# Patient Record
Sex: Female | Born: 1975 | Race: White | Hispanic: No | Marital: Single | Smoking: Former smoker
Health system: Southern US, Community
[De-identification: ages and names within clinical notes are randomized; demographics above are authoritative.]

## PROBLEM LIST (undated history)

## (undated) ENCOUNTER — Emergency Department (HOSPITAL_BASED_OUTPATIENT_CLINIC_OR_DEPARTMENT_OTHER): Discharge status: Absent without leave | Payer: Medicaid Other

## (undated) DIAGNOSIS — F1910 Other psychoactive substance abuse, uncomplicated: Secondary | ICD-10-CM

## (undated) DIAGNOSIS — F4310 Post-traumatic stress disorder, unspecified: Secondary | ICD-10-CM

## (undated) DIAGNOSIS — F32A Depression, unspecified: Secondary | ICD-10-CM

## (undated) DIAGNOSIS — M797 Fibromyalgia: Secondary | ICD-10-CM

## (undated) DIAGNOSIS — G43909 Migraine, unspecified, not intractable, without status migrainosus: Secondary | ICD-10-CM

## (undated) DIAGNOSIS — T4145XA Adverse effect of unspecified anesthetic, initial encounter: Secondary | ICD-10-CM

## (undated) DIAGNOSIS — R569 Unspecified convulsions: Secondary | ICD-10-CM

## (undated) DIAGNOSIS — F329 Major depressive disorder, single episode, unspecified: Secondary | ICD-10-CM

## (undated) DIAGNOSIS — F419 Anxiety disorder, unspecified: Secondary | ICD-10-CM

## (undated) DIAGNOSIS — T8859XA Other complications of anesthesia, initial encounter: Secondary | ICD-10-CM

## (undated) HISTORY — PX: WISDOM TOOTH EXTRACTION: SHX21

## (undated) HISTORY — PX: OTHER SURGICAL HISTORY: SHX169

---

## 1998-01-13 ENCOUNTER — Ambulatory Visit (HOSPITAL_COMMUNITY): Admission: RE | Admit: 1998-01-13 | Discharge: 1998-01-13 | Payer: Self-pay | Admitting: Gastroenterology

## 1999-02-24 ENCOUNTER — Other Ambulatory Visit: Admission: RE | Admit: 1999-02-24 | Discharge: 1999-02-24 | Payer: Self-pay | Admitting: Obstetrics and Gynecology

## 2000-06-22 ENCOUNTER — Other Ambulatory Visit: Admission: RE | Admit: 2000-06-22 | Discharge: 2000-06-22 | Payer: Self-pay | Admitting: Obstetrics and Gynecology

## 2000-06-22 ENCOUNTER — Other Ambulatory Visit: Admission: RE | Admit: 2000-06-22 | Discharge: 2000-06-22 | Payer: Self-pay | Admitting: *Deleted

## 2000-08-01 ENCOUNTER — Ambulatory Visit (HOSPITAL_COMMUNITY): Admission: RE | Admit: 2000-08-01 | Discharge: 2000-08-01 | Payer: Self-pay | Admitting: Endocrinology

## 2000-08-01 ENCOUNTER — Encounter: Payer: Self-pay | Admitting: Endocrinology

## 2000-08-21 DIAGNOSIS — R569 Unspecified convulsions: Secondary | ICD-10-CM

## 2000-08-21 HISTORY — DX: Unspecified convulsions: R56.9

## 2000-11-27 ENCOUNTER — Encounter: Admission: RE | Admit: 2000-11-27 | Discharge: 2000-11-27 | Payer: Self-pay | Admitting: Infectious Diseases

## 2001-07-11 ENCOUNTER — Other Ambulatory Visit: Admission: RE | Admit: 2001-07-11 | Discharge: 2001-07-11 | Payer: Self-pay | Admitting: Obstetrics and Gynecology

## 2001-10-11 ENCOUNTER — Encounter: Payer: Self-pay | Admitting: Gastroenterology

## 2001-10-11 ENCOUNTER — Ambulatory Visit (HOSPITAL_COMMUNITY): Admission: RE | Admit: 2001-10-11 | Discharge: 2001-10-11 | Payer: Self-pay | Admitting: Gastroenterology

## 2001-10-18 ENCOUNTER — Encounter: Payer: Self-pay | Admitting: Gastroenterology

## 2001-10-18 ENCOUNTER — Ambulatory Visit (HOSPITAL_COMMUNITY): Admission: RE | Admit: 2001-10-18 | Discharge: 2001-10-18 | Payer: Self-pay | Admitting: Gastroenterology

## 2002-01-24 ENCOUNTER — Encounter: Payer: Self-pay | Admitting: Emergency Medicine

## 2002-01-24 ENCOUNTER — Emergency Department (HOSPITAL_COMMUNITY): Admission: EM | Admit: 2002-01-24 | Discharge: 2002-01-24 | Payer: Self-pay | Admitting: Emergency Medicine

## 2002-04-23 ENCOUNTER — Emergency Department (HOSPITAL_COMMUNITY): Admission: EM | Admit: 2002-04-23 | Discharge: 2002-04-23 | Payer: Self-pay | Admitting: Emergency Medicine

## 2002-10-07 ENCOUNTER — Other Ambulatory Visit: Admission: RE | Admit: 2002-10-07 | Discharge: 2002-10-07 | Payer: Self-pay | Admitting: Obstetrics and Gynecology

## 2002-10-07 ENCOUNTER — Emergency Department (HOSPITAL_COMMUNITY): Admission: EM | Admit: 2002-10-07 | Discharge: 2002-10-07 | Payer: Self-pay | Admitting: Emergency Medicine

## 2002-12-12 ENCOUNTER — Inpatient Hospital Stay (HOSPITAL_COMMUNITY): Admission: AD | Admit: 2002-12-12 | Discharge: 2002-12-12 | Payer: Self-pay | Admitting: Obstetrics & Gynecology

## 2003-03-09 ENCOUNTER — Ambulatory Visit (HOSPITAL_COMMUNITY): Admission: RE | Admit: 2003-03-09 | Discharge: 2003-03-09 | Payer: Self-pay | Admitting: Obstetrics and Gynecology

## 2003-03-09 ENCOUNTER — Other Ambulatory Visit: Admission: RE | Admit: 2003-03-09 | Discharge: 2003-03-09 | Payer: Self-pay | Admitting: Obstetrics and Gynecology

## 2003-03-29 ENCOUNTER — Emergency Department (HOSPITAL_COMMUNITY): Admission: EM | Admit: 2003-03-29 | Discharge: 2003-03-29 | Payer: Self-pay | Admitting: *Deleted

## 2003-05-26 ENCOUNTER — Inpatient Hospital Stay (HOSPITAL_COMMUNITY): Admission: RE | Admit: 2003-05-26 | Discharge: 2003-05-29 | Payer: Self-pay | Admitting: Obstetrics and Gynecology

## 2003-06-05 ENCOUNTER — Encounter: Admission: RE | Admit: 2003-06-05 | Discharge: 2003-07-05 | Payer: Self-pay | Admitting: Obstetrics and Gynecology

## 2003-06-25 ENCOUNTER — Other Ambulatory Visit: Admission: RE | Admit: 2003-06-25 | Discharge: 2003-06-25 | Payer: Self-pay | Admitting: Obstetrics and Gynecology

## 2003-07-17 ENCOUNTER — Emergency Department (HOSPITAL_COMMUNITY): Admission: AD | Admit: 2003-07-17 | Discharge: 2003-07-17 | Payer: Self-pay | Admitting: Family Medicine

## 2003-11-15 ENCOUNTER — Emergency Department (HOSPITAL_COMMUNITY): Admission: EM | Admit: 2003-11-15 | Discharge: 2003-11-16 | Payer: Self-pay | Admitting: Emergency Medicine

## 2005-02-16 ENCOUNTER — Encounter: Admission: RE | Admit: 2005-02-16 | Discharge: 2005-02-16 | Payer: Self-pay | Admitting: Family Medicine

## 2007-02-22 ENCOUNTER — Emergency Department (HOSPITAL_COMMUNITY): Admission: EM | Admit: 2007-02-22 | Discharge: 2007-02-22 | Payer: Self-pay | Admitting: Emergency Medicine

## 2008-01-18 ENCOUNTER — Emergency Department (HOSPITAL_COMMUNITY): Admission: EM | Admit: 2008-01-18 | Discharge: 2008-01-18 | Payer: Self-pay | Admitting: Emergency Medicine

## 2008-03-09 ENCOUNTER — Emergency Department (HOSPITAL_BASED_OUTPATIENT_CLINIC_OR_DEPARTMENT_OTHER): Admission: EM | Admit: 2008-03-09 | Discharge: 2008-03-09 | Payer: Self-pay | Admitting: Emergency Medicine

## 2008-08-21 ENCOUNTER — Emergency Department: Payer: Self-pay | Admitting: Emergency Medicine

## 2008-10-06 ENCOUNTER — Emergency Department: Payer: Self-pay | Admitting: Unknown Physician Specialty

## 2008-10-30 ENCOUNTER — Emergency Department: Payer: Self-pay | Admitting: Emergency Medicine

## 2009-02-26 ENCOUNTER — Emergency Department (HOSPITAL_COMMUNITY): Admission: EM | Admit: 2009-02-26 | Discharge: 2009-02-27 | Payer: Self-pay | Admitting: Emergency Medicine

## 2009-03-05 ENCOUNTER — Emergency Department (HOSPITAL_BASED_OUTPATIENT_CLINIC_OR_DEPARTMENT_OTHER): Admission: EM | Admit: 2009-03-05 | Discharge: 2009-03-05 | Payer: Self-pay | Admitting: Emergency Medicine

## 2009-06-29 ENCOUNTER — Emergency Department: Payer: Self-pay | Admitting: Emergency Medicine

## 2009-07-01 ENCOUNTER — Ambulatory Visit: Payer: Self-pay | Admitting: Diagnostic Radiology

## 2009-07-01 ENCOUNTER — Emergency Department (HOSPITAL_BASED_OUTPATIENT_CLINIC_OR_DEPARTMENT_OTHER): Admission: EM | Admit: 2009-07-01 | Discharge: 2009-07-01 | Payer: Self-pay | Admitting: Emergency Medicine

## 2009-07-14 ENCOUNTER — Emergency Department (HOSPITAL_BASED_OUTPATIENT_CLINIC_OR_DEPARTMENT_OTHER): Admission: EM | Admit: 2009-07-14 | Discharge: 2009-07-14 | Payer: Self-pay | Admitting: Emergency Medicine

## 2009-07-25 ENCOUNTER — Emergency Department (HOSPITAL_BASED_OUTPATIENT_CLINIC_OR_DEPARTMENT_OTHER): Admission: EM | Admit: 2009-07-25 | Discharge: 2009-07-25 | Payer: Self-pay | Admitting: Emergency Medicine

## 2009-07-25 ENCOUNTER — Ambulatory Visit: Payer: Self-pay | Admitting: Diagnostic Radiology

## 2009-07-28 ENCOUNTER — Emergency Department (HOSPITAL_COMMUNITY): Admission: EM | Admit: 2009-07-28 | Discharge: 2009-07-28 | Payer: Self-pay | Admitting: Emergency Medicine

## 2009-07-30 ENCOUNTER — Emergency Department: Payer: Self-pay | Admitting: Emergency Medicine

## 2009-08-17 ENCOUNTER — Emergency Department (HOSPITAL_COMMUNITY): Admission: EM | Admit: 2009-08-17 | Discharge: 2009-08-18 | Payer: Self-pay | Admitting: Emergency Medicine

## 2009-08-17 ENCOUNTER — Emergency Department (HOSPITAL_BASED_OUTPATIENT_CLINIC_OR_DEPARTMENT_OTHER): Admission: EM | Admit: 2009-08-17 | Discharge: 2009-08-17 | Payer: Self-pay | Admitting: Emergency Medicine

## 2009-10-07 ENCOUNTER — Emergency Department (HOSPITAL_BASED_OUTPATIENT_CLINIC_OR_DEPARTMENT_OTHER): Admission: EM | Admit: 2009-10-07 | Discharge: 2009-10-07 | Payer: Self-pay | Admitting: Emergency Medicine

## 2009-12-31 ENCOUNTER — Emergency Department (HOSPITAL_BASED_OUTPATIENT_CLINIC_OR_DEPARTMENT_OTHER): Admission: EM | Admit: 2009-12-31 | Discharge: 2009-12-31 | Payer: Self-pay | Admitting: Emergency Medicine

## 2010-01-07 ENCOUNTER — Emergency Department (HOSPITAL_BASED_OUTPATIENT_CLINIC_OR_DEPARTMENT_OTHER): Admission: EM | Admit: 2010-01-07 | Discharge: 2010-01-07 | Payer: Self-pay | Admitting: Emergency Medicine

## 2010-04-13 ENCOUNTER — Emergency Department (HOSPITAL_BASED_OUTPATIENT_CLINIC_OR_DEPARTMENT_OTHER): Admission: EM | Admit: 2010-04-13 | Discharge: 2010-04-14 | Payer: Self-pay | Admitting: Emergency Medicine

## 2010-04-13 ENCOUNTER — Ambulatory Visit: Payer: Self-pay | Admitting: Diagnostic Radiology

## 2010-07-20 ENCOUNTER — Emergency Department (HOSPITAL_BASED_OUTPATIENT_CLINIC_OR_DEPARTMENT_OTHER): Admission: EM | Admit: 2010-07-20 | Discharge: 2010-07-20 | Payer: Self-pay | Admitting: Emergency Medicine

## 2010-10-02 ENCOUNTER — Emergency Department (HOSPITAL_BASED_OUTPATIENT_CLINIC_OR_DEPARTMENT_OTHER)
Admission: EM | Admit: 2010-10-02 | Discharge: 2010-10-02 | Disposition: A | Discharge status: Home / Self Care | Payer: Medicaid Other | Attending: Emergency Medicine | Admitting: Emergency Medicine

## 2010-10-02 DIAGNOSIS — F341 Dysthymic disorder: Secondary | ICD-10-CM | POA: Insufficient documentation

## 2010-10-02 DIAGNOSIS — J329 Chronic sinusitis, unspecified: Secondary | ICD-10-CM | POA: Insufficient documentation

## 2010-11-01 LAB — URINALYSIS, ROUTINE W REFLEX MICROSCOPIC
Bilirubin Urine: NEGATIVE
Ketones, ur: NEGATIVE mg/dL
Nitrite: NEGATIVE
Protein, ur: NEGATIVE mg/dL
Urobilinogen, UA: 0.2 mg/dL (ref 0.0–1.0)

## 2010-11-01 LAB — DIFFERENTIAL
Eosinophils Relative: 1 % (ref 0–5)
Lymphocytes Relative: 27 % (ref 12–46)
Lymphs Abs: 2.1 10*3/uL (ref 0.7–4.0)
Monocytes Relative: 4 % (ref 3–12)
Neutrophils Relative %: 68 % (ref 43–77)

## 2010-11-01 LAB — CBC
Hemoglobin: 14 g/dL (ref 12.0–15.0)
MCH: 31.8 pg (ref 26.0–34.0)
MCHC: 34.9 g/dL (ref 30.0–36.0)
Platelets: 257 10*3/uL (ref 150–400)
RDW: 11.4 % — ABNORMAL LOW (ref 11.5–15.5)

## 2010-11-01 LAB — COMPREHENSIVE METABOLIC PANEL
ALT: 18 U/L (ref 0–35)
Albumin: 4.5 g/dL (ref 3.5–5.2)
Calcium: 9.9 mg/dL (ref 8.4–10.5)
Glucose, Bld: 107 mg/dL — ABNORMAL HIGH (ref 70–99)
Sodium: 141 mEq/L (ref 135–145)
Total Protein: 8 g/dL (ref 6.0–8.3)

## 2010-11-01 LAB — PREGNANCY, URINE

## 2010-12-03 ENCOUNTER — Emergency Department (HOSPITAL_BASED_OUTPATIENT_CLINIC_OR_DEPARTMENT_OTHER)
Admission: EM | Admit: 2010-12-03 | Discharge: 2010-12-03 | Disposition: A | Discharge status: Home / Self Care | Payer: Self-pay | Attending: Emergency Medicine | Admitting: Emergency Medicine

## 2010-12-03 DIAGNOSIS — Z79899 Other long term (current) drug therapy: Secondary | ICD-10-CM | POA: Insufficient documentation

## 2010-12-03 DIAGNOSIS — G43909 Migraine, unspecified, not intractable, without status migrainosus: Secondary | ICD-10-CM | POA: Insufficient documentation

## 2011-01-06 NOTE — Op Note (Signed)
NAME:  Shelly Cummings, Shelly Cummings                           ACCOUNT NO.:  [id number]   MEDICAL RECORD NO.:  [id number]                   PATIENT TYPE:  INP   LOCATION:  9102                                 FACILITY:  [facility]   PHYSICIAN:  Malva Limes, M.D.                 DATE OF BIRTH:  1975-11-13   DATE OF PROCEDURE:  05/26/2003  DATE OF DISCHARGE:                                 OPERATIVE REPORT   PREOPERATIVE DIAGNOSES:  1. Intrauterine pregnancy at term.  2. Persistent breech presentation.  3. The patient declines attempted external version.   POSTOPERATIVE DIAGNOSES:  1. Intrauterine pregnancy at term.  2. Persistent breech presentation.  3. The patient declines attempted external version.   PROCEDURE:  Primary low transverse cesarean section.   SURGEON:  Mark E. Dareen Piano, M.D.   ANESTHESIA:  Spinal.   ANTIBIOTICS:  Ancef 1 gram.   DRAINS:  Foley bedside drainage.   ESTIMATED BLOOD LOSS:  900 mL.   COMPLICATIONS:  None.   SPECIMENS:  None.   FINDINGS:  The patient had a normal fallopian tubes and ovaries bilaterally.  The uterus is normal in size and shape.  The patient delivered one live  viable white female infant with Apgars 8 at one minute and 9 at five  minutes.  The weight was 7 pounds and 14 ounces.   PROCEDURE:  The patient was taken to the operating room where a spinal  anesthetic was administered.  She was then placed in the dorsal supine  position with a left lateral tilt.  The patient was prepped with Hibiclens  and a Foley catheter was then placed.  She was draped in the usual fashion  for this procedure.  A Pfannenstiel incision was made.  This was carried  down to the fascia.  The fascia was entered in the midline and extended  laterally with the Mayo scissors.  The rectus muscles were dissected from  the fascia with the Bovie.  The rectus muscles were divided in the midline  and taken superiorly and inferiorly.  Parietal peritoneum was entered  sharply.   The bladder flap was taken down sharply.  A low transverse uterine  incision was made in the midline and extended laterally with blunt  dissection.  Amniotic sac was then entered and fluid was noted to be clear.  The infant was a footling breech.  The infant was delivered in a breech  presentation.  On delivery of the head, the oropharynx and nostrils were  bulb suctioned.  The cord was then doubly clamped and cut and the infant  handed to the waiting NICU team.  Cord blood was then obtained.  The  placenta was then manually removed.  The uterus exteriorized and the uterine  cavity was inspected.  The uterine cavity was wiped with a wet lap.  The  uterine incision was closed in a single layer of 0  chromic in a running  locking fashion.  The bladder flap was closed using 3-0 chromic in running  fashion.  The uterus was placed back in the abdominal cavity.  Hemostasis  was again checked and found to be adequate.  The parietal peritoneum and  rectus muscles were reapproximated in the midline using 0 chromic in a  running fashion.  The fascia was closed using 0 Monocryl suture in a running  fashion.  Subcuticular tissue was made hemostatic with the Bovie.  Stainless  steel clips were used to close the skin.  The patient tolerated the  procedure well.  She was taken to the recovery room in stable condition.  Instrument and lap counts were correct x2.                                               Malva Limes, M.D.    MA/MEDQ  D:  05/26/2003  T:  05/26/2003  Job:  [id number]

## 2011-01-06 NOTE — Discharge Summary (Signed)
   NAMEMarland Kitchen  Shelly Cummings, Shelly Cummings                           ACCOUNT NO.:  [id number]   MEDICAL RECORD NO.:  [id number]                   PATIENT TYPE:  INP   LOCATION:  9102                                 FACILITY:  [facility]   PHYSICIAN:  Gerrit Friends. Aldona Bar, M.D.                DATE OF BIRTH:  [birth year]   DATE OF ADMISSION:  05/26/2003  DATE OF DISCHARGE:  05/29/2003                                 DISCHARGE SUMMARY   DISCHARGE DIAGNOSES:  1. Term pregnancy, delivered 7 pound 14 ounce female infant Apgars 8 and 9.  2. Blood type A-.  Baby A- also.  RhoGAM not needed.  3. Breech presentation at term.   PROCEDURE:  Primary low transverse cesarean section.   SUMMARY:  This 35 year old gravida 2, para 0 was admitted for a primary  cesarean section at term with a breech presentation.  She was taken to the  OR on the day of admission, October 5 where she underwent a primary low  transverse cesarean section with delivery of a 7 pound 14 ounce female  infant.  Her postpartum course was uncomplicated.  As the baby was also Rh  negative RhoGAM was not needed.  The patient's discharge hemoglobin was 10.3  with a white count of 12,600 and a platelet count of 195,000.  She remained  afebrile during her hospital course.  On the morning of October 8 the wound  was clean and dry and her staples were removed and her wound was Steri-  Stripped with Benzoin.  She was given all appropriate instructions at the  time of discharge on October 8 and understood all instructions well.   DISCHARGE MEDICATIONS:  1. Vitamins one daily (the patient breast-feeding).  2. Ferrous sulfate 300 mg daily.  3. Motrin 600 mg q.6h. as needed for pain.  4. Tylox one to two q.4-6h. as needed for severe pain.   FOLLOWUP:  She will return to the office for follow-up in approximately four  weeks' time.   CONDITION ON DISCHARGE:  Improved.                                               Gerrit Friends. Aldona Bar, M.D.    RMW/MEDQ  D:  05/29/2003   T:  05/29/2003  Job:  [id number]

## 2011-02-04 ENCOUNTER — Emergency Department (INDEPENDENT_AMBULATORY_CARE_PROVIDER_SITE_OTHER): Payer: Self-pay

## 2011-02-04 ENCOUNTER — Emergency Department (HOSPITAL_BASED_OUTPATIENT_CLINIC_OR_DEPARTMENT_OTHER)
Admission: EM | Admit: 2011-02-04 | Discharge: 2011-02-04 | Disposition: A | Discharge status: Home / Self Care | Payer: Self-pay | Attending: Emergency Medicine | Admitting: Emergency Medicine

## 2011-02-04 DIAGNOSIS — B9689 Other specified bacterial agents as the cause of diseases classified elsewhere: Secondary | ICD-10-CM | POA: Insufficient documentation

## 2011-02-04 DIAGNOSIS — R1031 Right lower quadrant pain: Secondary | ICD-10-CM

## 2011-02-04 DIAGNOSIS — N760 Acute vaginitis: Secondary | ICD-10-CM | POA: Insufficient documentation

## 2011-02-04 DIAGNOSIS — A499 Bacterial infection, unspecified: Secondary | ICD-10-CM | POA: Insufficient documentation

## 2011-02-04 LAB — URINALYSIS, ROUTINE W REFLEX MICROSCOPIC
Glucose, UA: NEGATIVE mg/dL
Leukocytes, UA: NEGATIVE
pH: 5.5 (ref 5.0–8.0)

## 2011-02-04 LAB — BASIC METABOLIC PANEL
BUN: 8 mg/dL (ref 6–23)
GFR calc Af Amer: >60 mL/min (ref >60)
GFR calc non Af Amer: >60 mL/min (ref >60)
Potassium: 3.8 mEq/L (ref 3.5–5.1)
Sodium: 137 mEq/L (ref 135–145)

## 2011-02-04 LAB — CBC
MCHC: 34.7 g/dL (ref 30.0–36.0)
RDW: 12.2 % (ref 11.5–15.5)

## 2011-02-04 LAB — DIFFERENTIAL
Basophils Absolute: 0 10*3/uL (ref 0.0–0.1)
Basophils Relative: 0 % (ref 0–1)
Eosinophils Absolute: 0.2 10*3/uL (ref 0.0–0.7)
Eosinophils Relative: 3 % (ref 0–5)
Monocytes Absolute: 0.6 10*3/uL (ref 0.1–1.0)

## 2011-02-04 LAB — WET PREP, GENITAL: Trich, Wet Prep: NONE SEEN

## 2011-02-04 LAB — PREGNANCY, URINE: Preg Test, Ur: NEGATIVE

## 2011-02-04 MED ORDER — IOHEXOL 300 MG/ML  SOLN
100.0000 mL | Freq: Once | INTRAMUSCULAR | Status: AC | PRN
  Administered 2011-02-04: 100 mL via INTRAVENOUS

## 2011-02-06 LAB — GC/CHLAMYDIA PROBE AMP, GENITAL: Chlamydia, DNA Probe: NEGATIVE

## 2011-05-03 ENCOUNTER — Encounter: Payer: Self-pay | Admitting: *Deleted

## 2011-05-03 ENCOUNTER — Emergency Department (HOSPITAL_BASED_OUTPATIENT_CLINIC_OR_DEPARTMENT_OTHER)
Admission: EM | Admit: 2011-05-03 | Discharge: 2011-05-03 | Disposition: A | Discharge status: Home / Self Care | Payer: Self-pay | Attending: Emergency Medicine | Admitting: Emergency Medicine

## 2011-05-03 DIAGNOSIS — IMO0001 Reserved for inherently not codable concepts without codable children: Secondary | ICD-10-CM | POA: Insufficient documentation

## 2011-05-03 DIAGNOSIS — F341 Dysthymic disorder: Secondary | ICD-10-CM | POA: Insufficient documentation

## 2011-05-03 DIAGNOSIS — J069 Acute upper respiratory infection, unspecified: Secondary | ICD-10-CM | POA: Insufficient documentation

## 2011-05-03 HISTORY — DX: Fibromyalgia: M79.7

## 2011-05-03 HISTORY — DX: Migraine, unspecified, not intractable, without status migrainosus: G43.909

## 2011-05-03 HISTORY — DX: Anxiety disorder, unspecified: F41.9

## 2011-05-03 HISTORY — DX: Depression, unspecified: F32.A

## 2011-05-03 HISTORY — DX: Major depressive disorder, single episode, unspecified: F32.9

## 2011-05-03 NOTE — ED Provider Notes (Signed)
History     CSN: [id number] Arrival date & time: 05/03/2011  8:24 PM Pt seen at 2055 Chief Complaint  Patient presents with  . URI   Patient is a 35 y.o. female presenting with URI. The history is provided by the patient.  URI The primary symptoms include fever, sore throat, cough and nausea. The current episode started 2 days ago. This is a new problem. The problem has been gradually improving.  The onset of the illness is associated with exposure to sick contacts. Symptoms associated with the illness include chills.  pt reports feeling improved Reports cough/congestion/post nasal drip  Past Medical History  Diagnosis Date  . Migraines   . Anxiety   . Depression   . Fibromyalgia     Past Surgical History  Procedure Date  . Cesarean section   . Adenoidectomy   . Wisdom tooth extraction   . Eardrum repair     No family history on file.  History  Substance Use Topics  . Smoking status: Current Everyday Smoker  . Smokeless tobacco: Not on file  . Alcohol Use: No    OB History    Grav Para Term Preterm Abortions TAB SAB Ect Mult Living                  Review of Systems  Constitutional: Positive for fever and chills.  HENT: Positive for sore throat.   Respiratory: Positive for cough.   Gastrointestinal: Positive for nausea.    Physical Exam  BP 127/83  Pulse 88  Temp(Src) 98.6 F (37 C) (Oral)  Resp 18  Ht 5\' 1"  (1.549 m)  Wt 155 lb (70.308 kg)  BMI 29.29 kg/m2  SpO2 97%  LMP 02/23/2011  Physical Exam  CONSTITUTIONAL: Well developed/well nourished HEAD AND FACE: Normocephalic/atraumatic EYES: EOMI/PERRL ENMT: Mucous membranes moist, uvula midline, mucous membranes Pharynx normal NECK: supple no meningeal signs SPINE:entire spine nontender CV: S1/S2 noted, no murmurs/rubs/gallops noted LUNGS: Lungs are clear to auscultation bilaterally, no apparent distress ABDOMEN: soft, nontender, no rebound or guarding NEURO: Pt is awake/alert, moves all  extremitiesx4 EXTREMITIES: pulses normal, full ROM SKIN: warm, color normal PSYCH: no abnormalities of mood noted   ED Course  Procedures  MDM Nursing notes reviewed and considered in documentation Pt improved, just requesting work note      Joya Gaskins, MD 05/03/11 2131

## 2011-05-03 NOTE — ED Notes (Signed)
C/o cold/URI symptoms x2-3days. Runny nose, congestion, watery eyes and minimal nausea. intermitent fever. Pt states she is here for a work note clearing her to go back to work.

## 2011-06-06 LAB — RAPID URINE DRUG SCREEN, HOSP PERFORMED
Amphetamines: POSITIVE — AB
Benzodiazepines: POSITIVE — AB
Cocaine: NOT DETECTED
Opiates: NOT DETECTED
Tetrahydrocannabinol: NOT DETECTED

## 2011-06-06 LAB — DIFFERENTIAL
Basophils Absolute: 0.1
Basophils Relative: 1
Eosinophils Absolute: 0.1
Eosinophils Relative: 1
Lymphocytes Relative: 26
Lymphs Abs: 3
Monocytes Absolute: 1.1 — ABNORMAL HIGH
Monocytes Relative: 9
Neutro Abs: 7.4
Neutrophils Relative %: 64

## 2011-06-06 LAB — BASIC METABOLIC PANEL
BUN: 12
CO2: 23
Calcium: 10.3
Chloride: 107
Creatinine, Ser: 1.15
GFR calc non Af Amer: 56 — ABNORMAL LOW
Glucose, Bld: 110 — ABNORMAL HIGH
Potassium: 4.2
Sodium: 142

## 2011-06-06 LAB — CBC
MCV: 88
RBC: 4.36
WBC: 11.6 — ABNORMAL HIGH

## 2011-06-06 LAB — CARBOXYHEMOGLOBIN
Carboxyhemoglobin: 0.4 — ABNORMAL LOW
Methemoglobin: 0.5
O2 Saturation: 21.2

## 2011-06-06 LAB — ACETAMINOPHEN LEVEL

## 2011-06-06 LAB — ETHANOL

## 2011-07-02 ENCOUNTER — Encounter (HOSPITAL_BASED_OUTPATIENT_CLINIC_OR_DEPARTMENT_OTHER): Payer: Self-pay | Admitting: *Deleted

## 2011-07-02 ENCOUNTER — Emergency Department (INDEPENDENT_AMBULATORY_CARE_PROVIDER_SITE_OTHER): Payer: Self-pay

## 2011-07-02 ENCOUNTER — Emergency Department (HOSPITAL_BASED_OUTPATIENT_CLINIC_OR_DEPARTMENT_OTHER)
Admission: EM | Admit: 2011-07-02 | Discharge: 2011-07-02 | Disposition: A | Discharge status: Home / Self Care | Payer: Self-pay | Attending: Emergency Medicine | Admitting: Emergency Medicine

## 2011-07-02 DIAGNOSIS — IMO0001 Reserved for inherently not codable concepts without codable children: Secondary | ICD-10-CM | POA: Insufficient documentation

## 2011-07-02 DIAGNOSIS — Y92009 Unspecified place in unspecified non-institutional (private) residence as the place of occurrence of the external cause: Secondary | ICD-10-CM | POA: Insufficient documentation

## 2011-07-02 DIAGNOSIS — W19XXXA Unspecified fall, initial encounter: Secondary | ICD-10-CM | POA: Insufficient documentation

## 2011-07-02 DIAGNOSIS — S52612A Displaced fracture of left ulna styloid process, initial encounter for closed fracture: Secondary | ICD-10-CM

## 2011-07-02 DIAGNOSIS — Z79899 Other long term (current) drug therapy: Secondary | ICD-10-CM | POA: Insufficient documentation

## 2011-07-02 DIAGNOSIS — S52509A Unspecified fracture of the lower end of unspecified radius, initial encounter for closed fracture: Secondary | ICD-10-CM | POA: Insufficient documentation

## 2011-07-02 DIAGNOSIS — F17200 Nicotine dependence, unspecified, uncomplicated: Secondary | ICD-10-CM | POA: Insufficient documentation

## 2011-07-02 DIAGNOSIS — S52609A Unspecified fracture of lower end of unspecified ulna, initial encounter for closed fracture: Secondary | ICD-10-CM

## 2011-07-02 DIAGNOSIS — F341 Dysthymic disorder: Secondary | ICD-10-CM | POA: Insufficient documentation

## 2011-07-02 DIAGNOSIS — S52502A Unspecified fracture of the lower end of left radius, initial encounter for closed fracture: Secondary | ICD-10-CM

## 2011-07-02 MED ORDER — HYDROCODONE-ACETAMINOPHEN 5-325 MG PO TABS
ORAL_TABLET | ORAL | Status: AC
  Filled 2011-07-02: qty 1

## 2011-07-02 MED ORDER — HYDROMORPHONE HCL PF 2 MG/ML IJ SOLN
2.0000 mg | Freq: Once | INTRAMUSCULAR | Status: AC
  Administered 2011-07-02: 2 mg via INTRAMUSCULAR
  Filled 2011-07-02: qty 1

## 2011-07-02 MED ORDER — HYDROCODONE-ACETAMINOPHEN 5-325 MG PO TABS
1.0000 | ORAL_TABLET | Freq: Once | ORAL | Status: AC
  Administered 2011-07-02: 1 via ORAL

## 2011-07-02 MED ORDER — HYDROCODONE-ACETAMINOPHEN 5-325 MG PO TABS: 1.0000 | ORAL_TABLET | Freq: Four times a day (QID) | ORAL | Status: DC | PRN

## 2011-07-02 NOTE — ED Notes (Signed)
Appx an hour ago patient fell and tried to catch herself with her left arm. Now c/o pain, burning, swelling

## 2011-07-02 NOTE — ED Provider Notes (Addendum)
History     CSN: [id number] Arrival date & time: 07/02/2011  4:53 AM   First MD Initiated Contact with Patient 07/02/11 [phone]      Chief Complaint  Patient presents with  . Arm Injury    (Consider location/radiation/quality/duration/timing/severity/associated sxs/prior treatment) HPI She was outside smoking a cigarette at 4:00 this morning and fell backwards falling onto her outstretched left hand. She is now having severe, burning, pain in the left wrist radiating up the arm. The pain is worse with palpation or attempted movement of the left wrist. There is no motor or sensory deficit distal to the injury. There is associated swelling and decreased range of motion of the left wrist. She denies other injury. She was given an ice pack on arrival in the ED.  Past Medical History  Diagnosis Date  . Migraines   . Anxiety   . Depression   . Fibromyalgia   . Fibromyalgia   . Migraine   . Depression     Past Surgical History  Procedure Date  . Cesarean section   . Adenoidectomy   . Wisdom tooth extraction   . Eardrum repair     No family history on file.  History  Substance Use Topics  . Smoking status: Current Everyday Smoker  . Smokeless tobacco: Not on file  . Alcohol Use: No    OB History    Grav Para Term Preterm Abortions TAB SAB Ect Mult Living                  Review of Systems  All other systems reviewed and are negative.    Allergies  Sulfa antibiotics  Home Medications   Current Outpatient Rx  Name Route Sig Dispense Refill  . ARIPIPRAZOLE 10 MG PO TABS Oral Take 10 mg by mouth daily.      . ADAPALENE 0.1 % EX GEL Topical Apply topically at bedtime.      Marland Kitchen DIPHENHYDRAMINE HCL 25 MG PO CAPS Oral Take 25 mg by mouth at bedtime as needed. sleep     . ESCITALOPRAM OXALATE 20 MG PO TABS Oral Take 20 mg by mouth daily.      Marland Kitchen GABAPENTIN 600 MG PO TABS Oral Take 600 mg by mouth 3 (three) times daily.      Marland Kitchen LEVONORGESTREL 20 MCG/24HR IU IUD  Intrauterine 1 each by Intrauterine route once.      Marland Kitchen ONE-DAILY MULTI VITAMINS PO TABS Oral Take 1 tablet by mouth daily.        BP 132/84  Pulse 82  SpO2 99%  Physical Exam General: Well-developed, well-nourished female in no acute distress; appearance consistent with age of record HENT: normocephalic, atraumatic Eyes: Normal appearance Neck: supple Heart: regular rate and rhythm Lungs: Normal respiratory effort and excursion Abdomen: soft; nondistended Extremities: Left wrist swelling, tenderness, volar ecchymosis and mild deformity; tendon function intact at left wrist and fingers but with decreased range of motion; sensation intact distally; radial pulse +2; distal capillary refill brisk Neurologic: Awake, alert and oriented; motor function intact in all extremities and symmetric; sensation normal; no facial droop Skin: Warm and dry     ED Course  Procedures (including critical care time)     MDM   Nursing notes and vitals signs, including pulse oximetry, reviewed.  Summary of this visit's results, reviewed by myself:  Imaging Studies: Dg Forearm Left  07/02/2011  *RADIOLOGY REPORT*  Clinical Data: Status post fall, with left forearm pain.  LEFT FOREARM - 2  VIEW  Comparison: None.  Findings: The carpal rows are not well assessed due to limitations in positioning.  However, on concurrent wrist radiographs, there is no evidence for carpal row disruption.  There is a comminuted fracture through the distal radial metaphysis, with extension to the distal radiocarpal joint.  This demonstrates mild impaction and dorsal tilt.  A mildly displaced ulnar styloid fracture is also seen.  Overlying soft tissue swelling is noted.  The proximal radius and ulna appear intact.  No elbow joint effusion is identified.  IMPRESSION:  1.  Comminuted fracture through the distal radial metaphysis, with extension to the distal radiocarpal joint; this demonstrates mild impaction and dorsal tilt. 2.   Mildly displaced ulnar styloid fracture noted.  Original Report Authenticated By: Tonia Ghent, M.D.            Hanley Seamen, MD 07/02/11 0454  Hanley Seamen, MD 07/02/11 [phone]

## 2011-07-05 ENCOUNTER — Other Ambulatory Visit: Payer: Self-pay | Admitting: Orthopedic Surgery

## 2011-07-05 ENCOUNTER — Encounter (HOSPITAL_BASED_OUTPATIENT_CLINIC_OR_DEPARTMENT_OTHER): Payer: Self-pay | Admitting: *Deleted

## 2011-07-06 ENCOUNTER — Encounter (HOSPITAL_BASED_OUTPATIENT_CLINIC_OR_DEPARTMENT_OTHER): Payer: Self-pay | Admitting: *Deleted

## 2011-07-06 ENCOUNTER — Encounter (HOSPITAL_BASED_OUTPATIENT_CLINIC_OR_DEPARTMENT_OTHER): Payer: Self-pay | Admitting: Orthopedic Surgery

## 2011-07-06 ENCOUNTER — Ambulatory Visit (HOSPITAL_BASED_OUTPATIENT_CLINIC_OR_DEPARTMENT_OTHER)
Admission: RE | Admit: 2011-07-06 | Discharge: 2011-07-06 | Disposition: A | Discharge status: Home / Self Care | Payer: Medicaid Other | Source: Ambulatory Visit | Attending: Orthopedic Surgery | Admitting: Orthopedic Surgery

## 2011-07-06 ENCOUNTER — Encounter (HOSPITAL_BASED_OUTPATIENT_CLINIC_OR_DEPARTMENT_OTHER)
Admission: RE | Disposition: A | Discharge status: Home / Self Care | Payer: Self-pay | Source: Ambulatory Visit | Attending: Orthopedic Surgery

## 2011-07-06 ENCOUNTER — Encounter (HOSPITAL_BASED_OUTPATIENT_CLINIC_OR_DEPARTMENT_OTHER): Payer: Self-pay | Admitting: Certified Registered"

## 2011-07-06 ENCOUNTER — Encounter (HOSPITAL_BASED_OUTPATIENT_CLINIC_OR_DEPARTMENT_OTHER): Payer: Self-pay | Admitting: Anesthesiology

## 2011-07-06 ENCOUNTER — Ambulatory Visit (HOSPITAL_BASED_OUTPATIENT_CLINIC_OR_DEPARTMENT_OTHER): Payer: Medicaid Other | Admitting: Anesthesiology

## 2011-07-06 DIAGNOSIS — W19XXXA Unspecified fall, initial encounter: Secondary | ICD-10-CM | POA: Insufficient documentation

## 2011-07-06 DIAGNOSIS — S52599A Other fractures of lower end of unspecified radius, initial encounter for closed fracture: Secondary | ICD-10-CM | POA: Insufficient documentation

## 2011-07-06 HISTORY — DX: Other complications of anesthesia, initial encounter: T88.59XA

## 2011-07-06 HISTORY — PX: ORIF RADIAL FRACTURE: SHX5113

## 2011-07-06 HISTORY — DX: Adverse effect of unspecified anesthetic, initial encounter: T41.45XA

## 2011-07-06 HISTORY — DX: Unspecified convulsions: R56.9

## 2011-07-06 SURGERY — OPEN REDUCTION INTERNAL FIXATION (ORIF) RADIAL FRACTURE
Anesthesia: Choice | Site: Hand | Laterality: Left | Wound class: Clean

## 2011-07-06 MED ORDER — FENTANYL CITRATE 0.05 MG/ML IJ SOLN
INTRAMUSCULAR | Status: DC | PRN
  Administered 2011-07-06: 100 ug via INTRAVENOUS

## 2011-07-06 MED ORDER — MEPERIDINE HCL 25 MG/ML IJ SOLN: 6.2500 mg | INTRAMUSCULAR | Status: DC | PRN

## 2011-07-06 MED ORDER — CHLORHEXIDINE GLUCONATE 4 % EX LIQD: 60.0000 mL | Freq: Once | CUTANEOUS | Status: DC

## 2011-07-06 MED ORDER — ONDANSETRON HCL 4 MG/2ML IJ SOLN
INTRAMUSCULAR | Status: DC | PRN
  Administered 2011-07-06: 4 mg via INTRAVENOUS

## 2011-07-06 MED ORDER — HYDROMORPHONE HCL PF 1 MG/ML IJ SOLN
0.2500 mg | INTRAMUSCULAR | Status: DC | PRN
  Administered 2011-07-06 (×4): 0.5 mg via INTRAVENOUS

## 2011-07-06 MED ORDER — LACTATED RINGERS IV SOLN
INTRAVENOUS | Status: DC
  Administered 2011-07-06 (×2): via INTRAVENOUS

## 2011-07-06 MED ORDER — BUPIVACAINE-EPINEPHRINE PF 0.5-1:200000 % IJ SOLN: INTRAMUSCULAR | Status: DC | PRN

## 2011-07-06 MED ORDER — PROMETHAZINE HCL 25 MG/ML IJ SOLN: 6.2500 mg | INTRAMUSCULAR | Status: DC | PRN

## 2011-07-06 MED ORDER — CEFAZOLIN SODIUM 1-5 GM-% IV SOLN
1.0000 g | Freq: Once | INTRAVENOUS | Status: AC
  Administered 2011-07-06: 1 g via INTRAVENOUS

## 2011-07-06 MED ORDER — PROPOFOL 10 MG/ML IV EMUL
INTRAVENOUS | Status: DC | PRN
  Administered 2011-07-06: 200 mg via INTRAVENOUS

## 2011-07-06 MED ORDER — LIDOCAINE HCL (CARDIAC) 20 MG/ML IV SOLN
INTRAVENOUS | Status: DC | PRN
  Administered 2011-07-06: 20 mg via INTRAVENOUS

## 2011-07-06 MED ORDER — MIDAZOLAM HCL 2 MG/2ML IJ SOLN
0.5000 mg | INTRAMUSCULAR | Status: DC | PRN
  Administered 2011-07-06: 2 mg via INTRAVENOUS

## 2011-07-06 MED ORDER — ROPIVACAINE HCL 5 MG/ML IJ SOLN
INTRAMUSCULAR | Status: DC | PRN
  Administered 2011-07-06: 40 mL via EPIDURAL

## 2011-07-06 MED ORDER — DEXAMETHASONE SODIUM PHOSPHATE 4 MG/ML IJ SOLN
INTRAMUSCULAR | Status: DC | PRN
  Administered 2011-07-06: 10 mg via INTRAVENOUS

## 2011-07-06 MED ORDER — MIDAZOLAM HCL 5 MG/5ML IJ SOLN
INTRAMUSCULAR | Status: DC | PRN
  Administered 2011-07-06: 2 mg via INTRAVENOUS

## 2011-07-06 MED ORDER — FENTANYL CITRATE 0.05 MG/ML IJ SOLN
50.0000 ug | INTRAMUSCULAR | Status: DC | PRN
  Administered 2011-07-06: 50 ug via INTRAVENOUS
  Administered 2011-07-06 (×2): 25 ug via INTRAVENOUS

## 2011-07-06 MED ORDER — OXYCODONE-ACETAMINOPHEN 5-325 MG PO TABS: ORAL_TABLET | ORAL | Status: AC

## 2011-07-06 SURGICAL SUPPLY — 49 items
.054"X6"GUIDE WIRE ×3 IMPLANT
BANDAGE ELASTIC 3 VELCRO ST LF (GAUZE/BANDAGES/DRESSINGS) ×2 IMPLANT
BANDAGE GAUZE ELAST BULKY 4 IN (GAUZE/BANDAGES/DRESSINGS) ×1 IMPLANT
BIT DRILL 2.0 LNG QUCK RELEASE (BIT) IMPLANT
BIT DRILL 2.8X5 QR DISP (BIT) ×1 IMPLANT
BLADE SURG 15 STRL LF DISP TIS (BLADE) IMPLANT
BLADE SURG 15 STRL SS (BLADE) ×2
BNDG CMPR 9X4 STRL LF SNTH (GAUZE/BANDAGES/DRESSINGS) ×1
BNDG ESMARK 4X9 LF (GAUZE/BANDAGES/DRESSINGS) ×1 IMPLANT
CHLORAPREP W/TINT 26ML (MISCELLANEOUS) ×1 IMPLANT
CLOTH BEACON ORANGE TIMEOUT ST (SAFETY) ×1 IMPLANT
CORDS BIPOLAR (ELECTRODE) ×1 IMPLANT
COVER MAYO STAND STRL (DRAPES) ×1 IMPLANT
COVER TABLE BACK 60X90 (DRAPES) ×1 IMPLANT
DRAPE EXTREMITY T 121X128X90 (DRAPE) ×1 IMPLANT
DRAPE OEC MINIVIEW 54X84 (DRAPES) ×1 IMPLANT
DRAPE SURG 17X23 STRL (DRAPES) ×2 IMPLANT
DRILL 2.0 LNG QUICK RELEASE (BIT) ×2
GAUZE XEROFORM 1X8 LF (GAUZE/BANDAGES/DRESSINGS) ×1 IMPLANT
GLOVE BIO SURGEON STRL SZ7.5 (GLOVE) ×1 IMPLANT
GLOVE BIOGEL PI IND STRL 8 (GLOVE) IMPLANT
GLOVE BIOGEL PI INDICATOR 8 (GLOVE) ×1
GLOVE SKINSENSE NS SZ7.0 (GLOVE) ×1
GLOVE SKINSENSE STRL SZ7.0 (GLOVE) IMPLANT
GOWN PREVENTION PLUS XLARGE (GOWN DISPOSABLE) ×1 IMPLANT
GOWN STRL REIN XL XLG (GOWN DISPOSABLE) ×1 IMPLANT
NS IRRIG 1000ML POUR BTL (IV SOLUTION) ×1 IMPLANT
PACK BASIN DAY SURGERY FS (CUSTOM PROCEDURE TRAY) ×1 IMPLANT
PAD CAST 4YDX4 CTTN HI CHSV (CAST SUPPLIES) IMPLANT
PADDING CAST ABS 4INX4YD NS (CAST SUPPLIES) ×1
PADDING CAST ABS COTTON 4X4 ST (CAST SUPPLIES) IMPLANT
PADDING CAST COTTON 4X4 STRL (CAST SUPPLIES) ×2
PLATE LEFT DIST RADIUS NARROW (Plate) ×1 IMPLANT
SCREW 3.5MMX10.0MM (Screw) ×2 IMPLANT
SCREW 3.5MMX12.0MM (Screw) ×1 IMPLANT
SCREW BN FT 16X2.3XLCK HEX CRT (Screw) IMPLANT
SCREW CORTICAL LOCKING 2.3X14M (Screw) ×1 IMPLANT
SCREW CORTICAL LOCKING 2.3X16M (Screw) ×4 IMPLANT
SCREW CORTICAL LOCKING 2.3X18M (Screw) ×6 IMPLANT
SCREW FX18X2.3XSMTH LCK NS CRT (Screw) IMPLANT
SLEEVE SCD COMPRESS KNEE MED (MISCELLANEOUS) ×1 IMPLANT
SPLINT PLASTER EXTRA FAST 3X15 (CAST SUPPLIES) ×10
SPLINT PLASTER GYPS XFAST 3X15 (CAST SUPPLIES) IMPLANT
SPONGE GAUZE 4X4 12PLY (GAUZE/BANDAGES/DRESSINGS) ×1 IMPLANT
STOCKINETTE 4X48 STRL (DRAPES) ×1 IMPLANT
SUT VICRYL 4-0 PS2 18IN ABS (SUTURE) ×1 IMPLANT
SYR BULB 3OZ (MISCELLANEOUS) ×1 IMPLANT
TOWEL OR 17X24 6PK STRL BLUE (TOWEL DISPOSABLE) ×2 IMPLANT
UNDERPAD 30X30 INCONTINENT (UNDERPADS AND DIAPERS) ×1 IMPLANT

## 2011-07-06 NOTE — H&P (Signed)
  Shelly Cummings is an 35 y.o. female.   Chief Complaint: left wrist fracture HPI: 35 yo female fell from standing height 07/02/11 onto left arm.  Seen at er.  XR show distal radius fracture with angulation.  No previous injuries reported.  Past Medical History  Diagnosis Date  . Migraines   . Anxiety   . Depression   . Fibromyalgia   . Fibromyalgia   . Migraine   . Depression   . Seizures 2002    no reason-none since  . Complication of anesthesia     she bleed alot after c-sec-hard to clot-no other time-no work-up    Past Surgical History  Procedure Date  . Cesarean section   . Adenoidectomy   . Wisdom tooth extraction   . Eardrum repair     History reviewed. No pertinent family history. Social History:  reports that she has been smoking.  She does not have any smokeless tobacco history on file. She reports that she does not drink alcohol or use illicit drugs.  Allergies:  Allergies  Allergen Reactions  . Sulfa Antibiotics Hives    Medications Prior to Admission  Medication Dose Route Frequency Provider Last Rate Last Dose  . ceFAZolin (ANCEF) IVPB 1 g/50 mL premix  1 g Intravenous Once       . chlorhexidine (HIBICLENS) 4 % liquid 4 application  60 mL Topical Once       . fentaNYL (SUBLIMAZE) injection 50-100 mcg  50-100 mcg Intravenous PRN Zenon Mayo, MD   25 mcg at 07/06/11 1227  . lactated ringers infusion   Intravenous Continuous Kerby Nora, MD 20 mL/hr at 07/06/11 1005    . midazolam (VERSED) injection 0.5-2 mg  0.5-2 mg Intravenous PRN Zenon Mayo, MD       Medications Prior to Admission  Medication Sig Dispense Refill  . ARIPiprazole (ABILIFY) 10 MG tablet Take 5 mg by mouth daily. Takes half      . gabapentin (NEURONTIN) 600 MG tablet Take 600 mg by mouth 3 (three) times daily.       . Multiple Vitamin (MULTIVITAMIN) tablet Take 1 tablet by mouth daily.        Marland Kitchen adapalene (DIFFERIN) 0.1 % gel Apply topically at bedtime.       Marland Kitchen  levonorgestrel (MIRENA) 20 MCG/24HR IUD 1 each by Intrauterine route once.          No results found for this or any previous visit (from the past 48 hour(s)).  No results found.   A comprehensive review of systems was negative except for: headaches and depression   Blood pressure 114/75, pulse 79, temperature 98 F (36.7 C), temperature source Oral, resp. rate 16, height 5\' 1"  (1.549 m), weight 70.308 kg (155 lb), SpO2 99.00%.  General appearance: alert, cooperative and appears stated age Head: Normocephalic, without obvious abnormality, atraumatic Neck: supple, symmetrical, trachea midline Resp: clear to auscultation bilaterally Cardio: regular rate and rhythm, S1, S2 normal, no murmur, click, rub or gallop GI: soft, non-tender; bowel sounds normal; no masses,  no organomegaly Extremities: left upper extremity light touch sensation intact all digits.  brisk capillary refill. +epl/fpl/io. Pulses: 2+ and symmetric Skin: Skin color, texture, turgor normal. No rashes or lesions Neurologic: Grossly normal Incision/Wound: No wound  Assessment/Plan Plan orif left distal radius.  Risks, benefits, alternatives discussed and patient agrees with plan of care.  Melissia Lahman R 07/06/2011, 12:45 PM

## 2011-07-06 NOTE — Anesthesia Postprocedure Evaluation (Signed)
  Anesthesia Post-op Note  Patient: Shelly Cummings  Procedure(s) Performed:  OPEN REDUCTION INTERNAL FIXATION (ORIF) RADIAL FRACTURE - ORIF LEFT DISTAL RADIUS  Patient Location: PACU  Anesthesia Type: General  Level of Consciousness: awake, alert  and oriented  Airway and Oxygen Therapy: Patient Spontanous Breathing  Post-op Pain: mild  Post-op Assessment: Post-op Vital signs reviewed and Patient's Cardiovascular Status Stable  Post-op Vital Signs: stable  Complications: No apparent anesthesia complications

## 2011-07-06 NOTE — Anesthesia Procedure Notes (Addendum)
Anesthesia Regional Block:  Intraclavicular brachial plexus block  Pre-Anesthetic Checklist: ,, timeout performed, Correct Patient, Correct Site, Correct Laterality, Correct Procedure, Correct Position, site marked, Risks and benefits discussed, pre-op evaluation, post-op pain management  Laterality: Left  Prep: Betadine       Needles:  Injection technique: Single-shot  Needle Type: Other   (Arrow 90mm)    Needle Gauge: 22 and 22 G    Additional Needles:  Procedures: nerve stimulator Intraclavicular brachial plexus block  Nerve Stimulator or Paresthesia:  Response: Posterior cord, 0.4 mA,  Response: Lateral cord,   Additional Responses:  Moved to axilla after only locating the post cord. Median nerve located at 0.4 mA. Intercostobrachial SQ Narrative:  Start time: 07/06/2011 12:40 PM End time: 07/06/2011 12:54 PM Injection made incrementally with aspirations every 5 mL.  Performed by: Personally   Additional Notes: 2% Lidocaine skin wheel.   Intraclavicular brachial plexus block Procedure Name: LMA Insertion Date/Time: 07/06/2011 2:23 PM Performed by: Radford Pax Pre-anesthesia Checklist: Patient identified, Timeout performed, Emergency Drugs available, Suction available and Patient being monitored Patient Re-evaluated:Patient Re-evaluated prior to inductionOxygen Delivery Method: Circle System Utilized Preoxygenation: Pre-oxygenation with 100% oxygen Intubation Type: IV induction Ventilation: Mask ventilation without difficulty LMA: LMA inserted LMA Size: 4.0 Number of attempts: 1 (atraumatic) Placement Confirmation: positive ETCO2 and breath sounds checked- equal and bilateral Tube secured with: Tape (paper tape used.  Bite gard left posterior) Dental Injury: Teeth and Oropharynx as per pre-operative assessment  Comments: LMA lubricated with surgilube.  Good fit and airway.  Paper tape used to secure LMA and to tape eyes shut

## 2011-07-06 NOTE — Brief Op Note (Signed)
07/06/2011  3:30 PM  PATIENT:  Shelly Cummings  35 y.o. female  PRE-OPERATIVE DIAGNOSIS:  LEFT DISTAL RADIUS FRACTURE  POST-OPERATIVE DIAGNOSIS:  LEFT DISTAL RADIUS FRACTURE  PROCEDURE:  Procedure(s): OPEN REDUCTION INTERNAL FIXATION (ORIF) RADIAL FRACTURE  SURGEON:  Surgeon(s): Tami Ribas  PHYSICIAN ASSISTANT:   ASSISTANTS: none   ANESTHESIA:   regional and general  EBL:  Total I/O In: 1000 [I.V.:1000] Out: -   BLOOD ADMINISTERED:none  DRAINS: none   LOCAL MEDICATIONS USED:  NONE  SPECIMEN:  No Specimen  DISPOSITION OF SPECIMEN:  N/A  COUNTS:  YES  TOURNIQUET:   Total Tourniquet Time Documented: Forearm (Left) - 51 minutes  DICTATION: .Other Dictation: Dictation Number [id number]  PLAN OF CARE: Discharge to home after PACU  PATIENT DISPOSITION:  PACU - hemodynamically stable.

## 2011-07-06 NOTE — Transfer of Care (Signed)
Immediate Anesthesia Transfer of Care Note  Patient: Shelly Cummings  Procedure(s) Performed:  OPEN REDUCTION INTERNAL FIXATION (ORIF) RADIAL FRACTURE - ORIF LEFT DISTAL RADIUS  Patient Location: PACU  Anesthesia Type: General  Level of Consciousness: awake, alert , oriented and patient cooperative  Airway & Oxygen Therapy: Patient Spontanous Breathing and Patient connected to face mask oxygen  Post-op Assessment: Report given to PACU RN and Post -op Vital signs reviewed and stable  Post vital signs: Reviewed and stable  Complications: No apparent anesthesia complications

## 2011-07-06 NOTE — Op Note (Signed)
NAMEJANANN, BOEVE NO.:  [id number]  MEDICAL RECORD NO.:  [id number]  LOCATION:  [facility]02                          FACILITY:  [facility]  PHYSICIAN:  Betha Loa, MD        DATE OF BIRTH:  1975/10/03  DATE OF PROCEDURE:  07/06/2011 DATE OF DISCHARGE:  07/02/2011                              OPERATIVE REPORT   PREOPERATIVE DIAGNOSIS:  Left distal radius fracture.  POSTOPERATIVE DIAGNOSIS:  Left distal radius fracture.  PROCEDURE:  Open reduction and internal fixation of left distal radius fracture.  SURGEON:  Betha Loa, MD.  ASSISTANT:  None.  ANESTHESIA:  General.  IV FLUIDS:  Per anesthesia flow sheet.  ESTIMATED BLOOD LOSS:  Minimal.  COMPLICATIONS:  None.  SPECIMENS:  None.  TOURNIQUET TIME:  51 minute.  DISPOSITION:  Stable to PACU.  INDICATIONS:  Ms. Houchin is a 35 year old female who fell from a standing height few days ago.  She was seen at the [facility] emergency department where radiographs were taken revealing a distal radius fracture.  She was placed in a splint, follow up with me in the office. On examination, she had intact sensation and capillary refill in all fingertips.  She can flex and extend the IP joint of her thumbs and cross the fingers.  Radiographs revealed a distal radius fracture that was extra-articular with dorsal angulation.  I discussed Ms. Krupka nature of the injury.  We discussed treatment with immobilization versus operative treatment with open reduction and internal fixation.  Risks, benefits, and alternatives of surgery were discussed including the risk of blood loss, infection, damage to nerves, vessels, tendons, ligaments, bone, fair surgery, need for additional surgery, complications with wound healing, continued pain, nonunion, malunion, stiffness.  She voiced understanding these risks and elected to proceed.  OPERATIVE COURSE:  After being identified preoperatively by myself, the Patient & I agreed upon the  procedure and site of procedure.  The surgical site was marked.  Risks, benefits, and alternatives of surgery were reviewed and she wished to proceed.  Surgical consent had been signed.  She was given 1 g of IV Ancef as preoperative antibiotic prophylaxis.  Regional block was performed by anesthesia in preoperative holding.  She was transported to the operating room and placed on the operating room table in supine position with left upper extremity on arm board.  General anesthesia was induced by the anesthesiologist.  The left upper extremity was prepped and draped in normal sterile orthopedic fashion.  Surgical pause was performed between surgeons, anesthesia, operating room staff, and all were agreement as to patient, procedure, and site of procedure.  Tourniquet at the proximal aspect of the Extremity was inflated to 250 mmHg after exsanguination of limb with an Esmarch bandage.  Standard volar Sherilyn Cooter approach was used.  Bipolar electrocautery was used throughout the case to obtain strict hemostasis. The superficial and deep portions of the FCR tendon sheath were incised. The FCR and the FPL were retracted ulnarly to protect the palmar cutaneous branch of the median nerve.  Pronator quadratus was released from the radial side of the radius and elevated using a periosteal elevator.  Brachioradialis was released.  Fracture site was easily Identified & was cleared of soft tissue.  A reduction was performed.  C- arm was used in AP, lateral, and oblique projections to ensure appropriate reduction which was the case.  A narrow standard locking plate from the Acumed distal radius set was selected.  Sizing was checked with the C-arm.  It was held in position using the guidewires. Position was again checked.  A single screw was placed in the slotted hole at the shaft of the plate using standard AO drilling and measuring technique.  The holes in the distal aspect of the plate were all  filled using smooth pegs with the exception of the radial styloid holes, which were filled with locking threaded screws.  The C-arm was again used in AP, lateral, and oblique projections to ensure appropriate reduction and placement of hardware which was the case.  There was no intra-articular penetration.  The remaining 2 holes in the shaft of the plate were filled again using standard AO drilling and measuring technique.  C-arm was used for final radiographs in AP, lateral, and oblique projections. There was anatomic reduction.  No intra-articular penetration.  The wrist was placed through full range of motion.  She had full pronation and supination.  The distal radioulnar joint was stable to shuck testing in both pronation and supination.  The wound was copiously irrigated with 500 mL of sterile saline by bulb syringe.  The pronator quadratus was repaired back over top of the plate using 4-0 Vicryl suture.  A single inverted interrupted Vicryl suture was placed in the subcutaneous tissues and the skin was closed with 4-0 nylon in a horizontal mattress fashion.  The wound was dressed with sterile Xeroform, 4x4s, and wrapped with Kerlix.  A volar splint was placed and wrapped with Kerlix and Ace bandage.  The tourniquet was deflated at 51 minutes.  Fingertips were pink with brisk capillary refill after deflation of the tourniquet.  The operative drapes were broken down.  The patient was awoken from anesthesia safely.  She was transferred back to stretcher and taken to PACU in stable condition.  She was written a prescription yesterday for Percocet 5/325 one to two p.o. q.6 hours p.r.n. pain, dispensed #50.  I will see her back in the office in 1 week for postoperative followup.     Betha Loa, MD     KK/MEDQ  D:  07/06/2011  T:  07/06/2011  Job:  [id number]

## 2011-07-06 NOTE — Anesthesia Preprocedure Evaluation (Signed)
Anesthesia Evaluation  Patient identified by MRN, date of birth, ID band Patient awake    Reviewed: Allergy & Precautions, H&P , NPO status , Patient's Chart, lab work & pertinent test results  Airway Mallampati: I TM Distance: >3 FB Neck ROM: full    Dental No notable dental hx. (+) Teeth Intact   Pulmonary neg pulmonary ROS,  clear to auscultation  Pulmonary exam normal       Cardiovascular neg cardio ROS regular Normal    Neuro/Psych Negative Neurological ROS  Negative Psych ROS   GI/Hepatic negative GI ROS, Neg liver ROS,   Endo/Other  Negative Endocrine ROS  Renal/GU negative Renal ROS  Genitourinary negative   Musculoskeletal   Abdominal   Peds  Hematology negative hematology ROS (+)   Anesthesia Other Findings   Reproductive/Obstetrics negative OB ROS                           Anesthesia Physical Anesthesia Plan  ASA: II  Anesthesia Plan: General and Regional   Post-op Pain Management: MAC Combined w/ Regional for Post-op pain   Induction: Intravenous  Airway Management Planned: LMA  Additional Equipment:   Intra-op Plan:   Post-operative Plan: Extubation in OR  Informed Consent: I have reviewed the patients History and Physical, chart, labs and discussed the procedure including the risks, benefits and alternatives for the proposed anesthesia with the patient or authorized representative who has indicated his/her understanding and acceptance.     Plan Discussed with: CRNA  Anesthesia Plan Comments:         Anesthesia Quick Evaluation

## 2011-07-06 NOTE — Op Note (Signed)
Dictation number: [id number]

## 2011-07-21 ENCOUNTER — Encounter (HOSPITAL_BASED_OUTPATIENT_CLINIC_OR_DEPARTMENT_OTHER): Payer: Self-pay | Admitting: Orthopedic Surgery

## 2011-07-24 ENCOUNTER — Ambulatory Visit: Payer: Self-pay | Attending: Orthopedic Surgery | Admitting: Occupational Therapy

## 2011-07-24 DIAGNOSIS — M25639 Stiffness of unspecified wrist, not elsewhere classified: Secondary | ICD-10-CM | POA: Insufficient documentation

## 2011-07-24 DIAGNOSIS — IMO0001 Reserved for inherently not codable concepts without codable children: Secondary | ICD-10-CM | POA: Insufficient documentation

## 2011-08-04 ENCOUNTER — Encounter (HOSPITAL_BASED_OUTPATIENT_CLINIC_OR_DEPARTMENT_OTHER): Payer: Self-pay

## 2012-03-11 ENCOUNTER — Other Ambulatory Visit (HOSPITAL_COMMUNITY): Payer: Medicaid Other | Admitting: Psychology

## 2012-03-11 ENCOUNTER — Ambulatory Visit (HOSPITAL_COMMUNITY)
Admission: RE | Admit: 2012-03-11 | Discharge: 2012-03-11 | Disposition: A | Discharge status: Home / Self Care | Payer: Medicaid Other | Attending: Psychiatry | Admitting: Psychiatry

## 2012-03-11 ENCOUNTER — Encounter (HOSPITAL_COMMUNITY): Payer: Self-pay | Admitting: Psychology

## 2012-03-11 ENCOUNTER — Encounter (HOSPITAL_COMMUNITY): Payer: Self-pay | Admitting: *Deleted

## 2012-03-11 DIAGNOSIS — F1920 Other psychoactive substance dependence, uncomplicated: Secondary | ICD-10-CM | POA: Insufficient documentation

## 2012-03-11 NOTE — Progress Notes (Signed)
Patient ID: Shelly Cummings, female   DOB: 11-Jul-1976, 36 y.o.   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 She had contacted me seeking additional structure and support for her recovery through the CD-IOP here at [facility]. The patient lives in [place] with her mother and 72 yo daughter. Her parents divorced when she was 2001 and the father lives in [place] and has remarried. She reported her father was an alcoholic. She has a young brother who lives in [place], [place]. The patient  reported she had been in Drug Court after being convicted of doctor shopping and writing scripts for Adderall, but was discharged on July 3rd because of new charges relating to driving without a license.  The patient reported she was diagnosed with ADD in high school and Ritalin transformed her life for the better. She became very involved in her high school, was a Biochemist, clinical, in the [employer] and accepted to [employer]. She reported she chose to attend the [employer] (her dream school), but was introduced to cocaine during her freshman year. Her drug use grew and she eventually returned home to [facility] after her sophomore year at [employer] because of poor grades. She continued to use stimulants and was eventually introduced to opiates. Her primary drug of addiction remains Adderall. The patient was diagnosed with Bipolar Disorder and has been receiving treatment at [facility] with Johnney Ou, a nurse practitioner. She is currently prescribed Lamictal, Cymbalta, and Trazadone. She reported that she struggles with the depression more than mania and feels that her medications are not quite right yet. The patient reported she has been in treatment before, including residential treatment at [facility], and IOP programs at [facility] and the [facility]. All documentation was reviewed,  signatures collected and the orientation completed successfully. The patient will return at 1 pm to begin the CD-IOP.

## 2012-03-11 NOTE — BH Assessment (Signed)
Assessment Note   Shelly Cummings is a 36 y.o. single white female.  She presents seeking enrollment in CD-IOP program.  She reports that she was admitted to [facility] for Amphetamine Dependence and Opioid Abuse from 07/2011 - 10/2011.  She has been in various residential and outpatient substance abuse programs arranged by the [facility] drug court over the past year-and-a-half.  However, she has three outstanding charges for driving with a revoked license, making her ineligible for further drug court services.  Nonetheless, she feels that she needs further structured program support to avoid relapsing.  Pt reports that her substance of choice was Adderall, which she started using by prescription at 36 y/o.  She started abusing it at 36 y/o and in the last 8 months of her use was taking 3 - 4 tabs, 3 - 4 times a day, ending in 10/2011.  She reports that she supported her addiction by "doctor shopping," resulting in her being charged and convicted of felony forging of prescriptions.  This is how she ended up being involved with the drug court, and she is currently on 1 year probation and 6 months curfew for the conviction.  She also reports a history of abusing narcotic pain medications, but only when they were available; she has never experience opiate withdrawal.  Pt also reports problems with depression, for which she is in treatment at [facility], with symptoms as indicated in the "risk to self" assessment below.  She denies any current or past SI, HI, or AH/VH, and she exhibits no delusional thought.  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 Migraines   . Anxiety   . Depression   . Fibromyalgia   . Fibromyalgia   . Migraine   . Depression   . Seizures 2002    no reason-none since  . Complication of anesthesia     she bleed alot after c-sec-hard to clot-no  other time-no work-up   Axis IV: economic problems, housing problems, occupational problems, problems related to legal system/crime and problems with primary support group Axis V: 41-50 serious symptoms  Past Medical History:  Past Medical History  Diagnosis Date  . Migraines   . Anxiety   . Depression   . Fibromyalgia   . Fibromyalgia   . Migraine   . Depression   . Seizures 2002    no reason-none since  . Complication of anesthesia     she bleed alot after c-sec-hard to clot-no other time-no work-up    Past Surgical History  Procedure Date  . Cesarean section   . Adenoidectomy   . Wisdom tooth extraction   . Eardrum repair   . Orif radial fracture 07/06/2011    Procedure: OPEN REDUCTION INTERNAL FIXATION (ORIF) RADIAL FRACTURE;  Surgeon: Tami Ribas;  Location: [facility];  Service: Orthopedics;  Laterality: Left;  ORIF LEFT DISTAL RADIUS    Family History: No family history on file.  Social History:  reports that she has never smoked. She has never used smokeless tobacco. She reports that she uses illicit drugs (Amphetamines and Other-see comments). She reports that she does not drink alcohol.  Additional Social History:  Alcohol / Drug Use Pain Medications: Hx of abusing unspecified pain medications Prescriptions: Hx of abusing prescription Adderall Over the Counter: None reported Longest period of sobriety (when/how long): >1 year in 2009 Negative  Consequences of Use: Financial;Legal;Personal relationships;Work / Mining engineer #1 Name of Substance 1: Adderall 1 - Age of First Use: 36 y/o 1 - Amount (size/oz): 3 - 4 tabs 1 - Frequency: 3 - 4 times a day 1 - Duration: 8 months, with lower level abuse for 13 years prior 1 - Last Use / Amount: Late March 2013 Substance #2 Name of Substance 2: Opiate pain medication 2 - Age of First Use: 36 y/o 2 - Amount (size/oz): Varied as available 2 - Frequency: Varied as available 2 - Duration: 12  years 2 - Last Use / Amount: November 2012  CIWA:   COWS:    Allergies:  Allergies  Allergen Reactions  . Sulfa Antibiotics Hives    Home Medications:  (Not in a hospital admission)  OB/GYN Status:  No LMP recorded. Patient is not currently having periods (Reason: IUD).  General Assessment Data Location of Assessment: [facility] Assessment Services Living Arrangements: Parent;Children (Mother, 54 y/o daughter) Can pt return to current living arrangement?: Yes Admission Status: Voluntary Is patient capable of signing voluntary admission?: Yes Transfer from: Home Referral Source: Self/Family/Friend  Education Status Is patient currently in school?: No  Risk to self Suicidal Ideation: No Suicidal Intent: No Is patient at risk for suicide?: No Suicidal Plan?: No Access to Means: No What has been your use of drugs/alcohol within the last 12 months?: Recent Hx of abusing Adderall and narcotic pain medications Previous Attempts/Gestures: No How many times?: 0  Other Self Harm Risks: Denies any history of suicidal ideation. Triggers for Past Attempts: Other (Comment) (Not applicable) Intentional Self Injurious Behavior: None Family Suicide History: Yes (Cousin: failed attempt; Hx of depression & anxiety in family) Recent stressful life event(s): Legal Issues;Other (Comment);Financial Problems (Living with mother, unemployed x 8 years.) Persecutory voices/beliefs?: No Depression: Yes Depression Symptoms: Insomnia;Fatigue;Guilt;Loss of interest in usual pleasures;Feeling worthless/self pity;Feeling angry/irritable (Hopelessness) Substance abuse history and/or treatment for substance abuse?: Yes (Hx of abusing Adderall and narcotic pain medications) Suicide prevention information given to non-admitted patients: Yes  Risk to Others Homicidal Ideation: No Thoughts of Harm to Others: No Current Homicidal Intent: No Current Homicidal Plan: No Access to Homicidal Means: No Identified  Victim: None History of harm to others?: No Assessment of Violence: None Noted Violent Behavior Description: Calm, cooperative Does patient have access to weapons?: No (Denies having access to guns) Criminal Charges Pending?: Yes Describe Pending Criminal Charges: Driving w/ license revoked x 3 (Currently on probation x1 yr & curfew x 6 mos for felony.) Does patient have a court date: Yes Court Date:  (03/15/2012; 03/18/2012; Sept 2013)  Psychosis Hallucinations: None noted Delusions: None noted  Mental Status Report Appear/Hygiene: Other (Comment) (Neat, appropriate) Eye Contact: Good Motor Activity: Restlessness (Mild repetative leg motion) Speech: Other (Comment) (Unremarkable) Level of Consciousness: Alert Mood: Anxious;Other (Comment) (Pleasant) Affect: Appropriate to circumstance Anxiety Level: Minimal Thought Processes: Coherent;Relevant Judgement: Unimpaired Orientation: Person;Place;Time;Situation Obsessive Compulsive Thoughts/Behaviors: None  Cognitive Functioning Concentration: Decreased Memory: Recent Intact;Remote Intact IQ: Average Insight: Good Impulse Control: Good (...except when intoxicated) Appetite: Good Weight Loss: 0  Weight Gain: 40  (x 1.5 years, since coming off of amphetamines) Sleep: No Change (Requires Trazodone in order to sleep) Total Hours of Sleep: 8  Vegetative Symptoms: Staying in bed (Some impairment to providing for daughter.)  ADLScreening [facility] Assessment Services) Patient's cognitive ability adequate to safely complete daily activities?: No Patient able to express need for assistance with ADLs?: No Independently performs ADLs?: No  Abuse/Neglect [facility]) Physical Abuse:  Denies Verbal Abuse: Denies Sexual Abuse: Denies  Prior Inpatient Therapy Prior Inpatient Therapy: Yes Prior Therapy Dates: 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 Prior Therapy Facilty/Provider(s): 11/2010 - 12/2010 [facility] for residential rehab Reason for Treatment:  07/2011 - 10/2011: [facility] for rehab  Prior Outpatient Therapy Prior Outpatient Therapy: Yes Prior Therapy Dates: Mid-2011 - 09/2011 Various drug court programs for rehab Prior Therapy Facilty/Provider(s): Current - [facility] for psychiatry Reason for Treatment: 2009 - present: [facility] for rehab  ADL Screening (condition at time of admission) Patient's cognitive ability adequate to safely complete daily activities?: No Patient able to express need for assistance with ADLs?: No Independently performs ADLs?: No Communication: Independent Dressing (OT): Independent Grooming: Independent Feeding: Independent Bathing: Independent Toileting: Independent In/Out Bed: Independent Walks in Home: Independent Weakness of Legs: None Weakness of Arms/Hands: None  Home Assistive Devices/Equipment Home Assistive Devices/Equipment: None    Abuse/Neglect Assessment (Assessment to be complete while patient is alone) Physical Abuse: Denies Verbal Abuse: Denies Sexual Abuse: Denies Exploitation of patient/patient's resources: Denies Self-Neglect: Denies Values / Beliefs Cultural Requests During Hospitalization: Other (comment) (Long history of 12-step meetings.)   Advance Directives (For Healthcare) Advance Directive: Patient does not have advance directive;Patient would not like information Pre-existing out of facility DNR order (yellow form or pink MOST form): No Nutrition Screen Diet: Regular Unintentional weight loss greater than 10lbs within the last month:  (Error in previous entry: pt gained 40# in past 1.5 years.) Problems chewing or swallowing foods and/or liquids: No Home Tube Feeding or Total Parenteral Nutrition (TPN): No Patient appears severely malnourished: No Pregnant or Lactating: No  Additional Information 1:1 In Past 12 Months?: No CIRT Risk: No Elopement Risk: No Does patient have medical clearance?: No     Disposition:   Disposition Disposition of Patient: Outpatient treatment Type of outpatient treatment: Chemical Dependence - Intensive Outpatient Pt reports that she had previously contacted Charmian Muff about enrolling in CD-IOP.  This Clinical research associate called Dewayne Hatch and confirmed that she is expecting pt today.  Pt sent directly to [facility].  On Site Evaluation by:   Reviewed with Physician:     Raphael Gibney 03/11/2012 10:44 AM

## 2012-03-12 NOTE — Progress Notes (Signed)
    Daily Group Progress Note  Program: CD-IOP   Group Time: 1-2:30 pm  Participation Level: Active  Behavioral Response: Appropriate and Sharing  Type of Therapy: Psycho-education Group  Topic: Family Sculpture continued: First half of group included other members sharing their family through sculpture. Some members had sculpted their families on Friday, and I asked that the remaining group members engage in this exercise as well. There were a variety of themes present, but the majority of them include emotional abuse and disharmony within the family unit. There was good disclosure among members and feedback provided by the audience observing the sculptures.   Group Time: 2:45- 4pm  Participation Level: Active  Behavioral Response: Sharing  Type of Therapy: Process Group  Topic: Process, Introduction, and Graduation: second half of group was spent in process. There were 2 new group members present and they were invited to introduce themselves and describe what they need through this program. As the session neared ending, a graduation ceremony was held for a successfully graduating member. Two former group members arrived to share in the ceremony. There were kind and supportive words shared among the group and the departing member shared emotional feelings about leaving and his appreciation to the group.   Summary: The patient was new to the group, but volunteered to 'sculpt' her family. Her sculpture showed 2 children and 2 parents. The parents were arguing while the oldest child - the patient - watched in fear as the argument increased in nature with lots of yelling. She explained her father was an alcoholic and his mother nagged him constantly. When they finally divorced, she became the target of her mother's criticism. The patient introduced herself and described how her addiction to Adderall and grown over the years and entailed legal problems. The patient made some good comments and  responded well to this first session.   Family Program: Family present? No   Name of family member(s):   UDS collected: No Results:   AA/NA attended?: YesTuesday, Wednesday, Friday and Saturday  Sponsor?: Yes   Turkessa Ostrom, LCAS

## 2012-03-13 ENCOUNTER — Other Ambulatory Visit (HOSPITAL_COMMUNITY): Payer: Medicaid Other | Attending: Psychiatry | Admitting: Psychology

## 2012-03-13 DIAGNOSIS — F1920 Other psychoactive substance dependence, uncomplicated: Secondary | ICD-10-CM

## 2012-03-13 DIAGNOSIS — G43909 Migraine, unspecified, not intractable, without status migrainosus: Secondary | ICD-10-CM | POA: Insufficient documentation

## 2012-03-13 DIAGNOSIS — F1520 Other stimulant dependence, uncomplicated: Secondary | ICD-10-CM | POA: Insufficient documentation

## 2012-03-13 DIAGNOSIS — F1110 Opioid abuse, uncomplicated: Secondary | ICD-10-CM | POA: Insufficient documentation

## 2012-03-13 DIAGNOSIS — F39 Unspecified mood [affective] disorder: Secondary | ICD-10-CM | POA: Insufficient documentation

## 2012-03-13 DIAGNOSIS — IMO0001 Reserved for inherently not codable concepts without codable children: Secondary | ICD-10-CM | POA: Insufficient documentation

## 2012-03-14 LAB — PRESCRIPTION ABUSE MONITORING 17P, URINE
Amphetamine/Meth: NEGATIVE ng/mL
Barbiturate Screen, Urine: NEGATIVE ng/mL
Benzodiazepine Screen, Urine: NEGATIVE ng/mL
Buprenorphine, Urine: NEGATIVE ng/mL
Carisoprodol, Urine: NEGATIVE ng/mL
Fentanyl, Ur: NEGATIVE ng/mL
MDMA URINE: NEGATIVE ng/mL
Oxycodone Screen, Ur: NEGATIVE ng/mL
Propoxyphene: NEGATIVE ng/mL
Zolpidem, Urine: NEGATIVE ng/mL

## 2012-03-14 LAB — ALCOHOL METABOLITE (ETG), URINE: Ethyl Glucuronide (EtG): NEGATIVE ng/mL

## 2012-03-14 NOTE — Progress Notes (Signed)
    Daily Group Progress Note  Program: CD-IOP   Group Time: 1-2:30 pm  Participation Level: Active  Behavioral Response: Appropriate  Type of Therapy: Psycho-education Group  Topic: "Be Smart, Not Strong": First half of group included psycho-education on the power of the addiction over the power of one's thinking. Chemically dependent people cannot expect their will power to keep them drug-free and it is imperative that they recognize high risk situations and avoid them. The handout provided was from the [facility]. It included identifying where one rates themselves on 11 different relapse prevention strategies. The totals were identified by each member and each group member identified where they needed to improve and strengthen their recovery strategies.  Group Time: 2:45- 4pm  Participation Level: Active  Behavioral Response: Sharing  Type of Therapy: Process Group  Topic: People on My Side of Recovery/Process; second half of group spent in process. Members shared their current difficulties and struggles in early recovery. at one point, members were asked to list the professionals, family members, friends, support groups, sponsor, and others who would support them in maintaining sobriety. The numbers listed varied widely among the group with those having longer periods of sobriety identifying more people.   Summary: The patient checked-in and showed her new ankle bracelet from probation. She will be wearing it for 6 months. When asked how she feels about it, she reported it is heavy and awkward. The patient admitted that she is embarrassed and will not be wearing shorts or capri's while it is on. The patient scored well on the worksheet and was able to identify numerous strategies that she has learned through her years of treatment. She also had a good list of support people who she could call on. In process, the patient reported her mother, daughter and brother are leaving for  [place] this weekend and will be gone for a week. Her mother is very concerned and is insisting that her daughter limit her time away from their condo. The patient agreed that it is a sign of confidence that they are even leaving her alone, but she feels like she can keep herself busy between meetings and this program and doing the things for her probation. The patient asked about meeting with the medical director on Friday and wondered if she would be able to see a psychiatrist? I suggested she meet with AW first and see if she feels he will be able to help her. The patient made some good comments and responded well to this intervention.   Family Program: Family present? No   Name of family member(s):   UDS collected: No Results:   AA/NA attended?: YesTuesday, Thursday, Friday, Saturday and Sunday  Sponsor?: Yes   Anissia Wessells, LCAS

## 2012-03-15 ENCOUNTER — Encounter (HOSPITAL_COMMUNITY): Payer: Self-pay | Admitting: Psychology

## 2012-03-15 ENCOUNTER — Other Ambulatory Visit (HOSPITAL_COMMUNITY): Payer: Medicaid Other | Attending: Psychiatry | Admitting: Psychology

## 2012-03-15 DIAGNOSIS — F39 Unspecified mood [affective] disorder: Secondary | ICD-10-CM | POA: Insufficient documentation

## 2012-03-15 DIAGNOSIS — G43909 Migraine, unspecified, not intractable, without status migrainosus: Secondary | ICD-10-CM | POA: Insufficient documentation

## 2012-03-15 DIAGNOSIS — IMO0001 Reserved for inherently not codable concepts without codable children: Secondary | ICD-10-CM | POA: Insufficient documentation

## 2012-03-15 DIAGNOSIS — F1620 Hallucinogen dependence, uncomplicated: Secondary | ICD-10-CM | POA: Insufficient documentation

## 2012-03-15 DIAGNOSIS — F1520 Other stimulant dependence, uncomplicated: Secondary | ICD-10-CM | POA: Insufficient documentation

## 2012-03-15 MED ORDER — ARIPIPRAZOLE 5 MG PO TABS: 2.5000 mg | ORAL_TABLET | Freq: Every day | ORAL | Status: DC

## 2012-03-15 MED ORDER — LAMOTRIGINE 100 MG PO TABS: 100.0000 mg | ORAL_TABLET | Freq: Every day | ORAL | Status: DC

## 2012-03-17 NOTE — Progress Notes (Signed)
Patient ID: Shelly Cummings, female   DOB: 1975-12-01, 36 y.o.   MRN: [id number] Treatment Planning Session: I met with the patient at the conclusion of her CD-IOP today. She had met with the Medical Director, Jorje Guild, during the group session and reported she was very pleased with their initial session and he had explained a number of things to her that she didn't know about her Bipolar Disorder and certain kinds of medications she has been prescribed. I explained the importance of identifying goals for treatment. She concurred with the first two goals, which include: sobriety and building support for her abstinence-based lifestyle. The patient also identified that addressing her mood disorder, specifically her depression was an important goal for her. In addition, she identified that completing her probation successfully was also extremely important. The documentation was reviewed and the treatment plan identified and completed accordingly.

## 2012-03-17 NOTE — Progress Notes (Signed)
    Daily Group Progress Note  Program: CD-IOP   Group Time: 1-2:30 pm  Participation Level: Active  Behavioral Response: Appropriate  Type of Therapy: Process Group  Topic: Group Process and Introductions: first half of group was spent in process. There were 2 new members in group today, and they introduced themselves during this part of group. One of the new members questioned whether she was actually an alcoholic or just an abuser. This led to a discussion, including a review of the 7 different criteria for CD as listed in the DSM-IV. There was good disclosure and an active engagement among the members.  Group Time: 2:45- 4pm  Participation Level: Active  Behavioral Response: Sharing  Type of Therapy: Psycho-education Group  Topic: Pros and Cons: second half of group spent identifying the pros and cons of using versus sobriety. Each member was able to identify numerous negatives of active drug use as well as pros and cons of sobriety, but there were very few pros noted for using. I pointed out that this seemed odd since so many of the group members had continued to use despite many negative consequences. The general consensus was that the benefit of using was that it served to "numb" the user. The conversation was lively and  members shared openly about their own alcohol and drug use.   Summary:The patient reported she was doing well, but had found out yesterday that she had missed a court date and that there was now a warrant out for her. Her mother had paid the $500 bond and the problem had been resolved. She showed the new group members her ankle bracelet that keeps track of her 24/7. The patient was the first new group member to meet with the medical director and she missed part of the first half of group. She returned and provided good feedback on the pros and cons of using versus sobriety. She reported that the good thing that using did was 'numb' her and this was really all she  could identify that was positive despite a 36 yo drug habit. The patient reported that her mother, 60 yo daughter, and brother had left today for [place]. She was going to be home by herself until they returned next Wednesday. When asked whether she was nervous, the patient reported she was pleased that she would be by herself for a few days and she had made plans with her sponsor and would be keeping busy. She reminded me that she had court on Monday in the morning and the afternoon and would not be present in group. The patient made some good comments.    Family Program: Family present? No   Name of family member(s):   UDS collected: No Results:   AA/NA attended?: YesMonday, Tuesday, Wednesday, Friday and Saturday  Sponsor?: Yes   Theodore Virgin, LCAS

## 2012-03-18 ENCOUNTER — Other Ambulatory Visit (HOSPITAL_COMMUNITY): Payer: Medicaid Other | Attending: Psychiatry

## 2012-03-20 ENCOUNTER — Other Ambulatory Visit (HOSPITAL_COMMUNITY): Payer: Medicaid Other

## 2012-03-20 ENCOUNTER — Encounter (HOSPITAL_COMMUNITY): Payer: Self-pay | Admitting: Psychology

## 2012-03-20 NOTE — Progress Notes (Signed)
Patient ID: Shelly Cummings, female   DOB: [birth year], 36 y.o.   MRN: [id number] Patient left voice mail this morning reporting she was sick with a temperature and would not be in group. She apologized, but stated she would be in group on Friday. The patient missed the group on Monday because of court dates in the morning and afternoon. I will excuse her from group today.

## 2012-03-22 ENCOUNTER — Other Ambulatory Visit (HOSPITAL_COMMUNITY): Payer: Medicaid Other | Attending: Psychiatry | Admitting: Psychology

## 2012-03-22 DIAGNOSIS — F39 Unspecified mood [affective] disorder: Secondary | ICD-10-CM | POA: Insufficient documentation

## 2012-03-22 DIAGNOSIS — F1920 Other psychoactive substance dependence, uncomplicated: Secondary | ICD-10-CM

## 2012-03-22 DIAGNOSIS — F1520 Other stimulant dependence, uncomplicated: Secondary | ICD-10-CM | POA: Insufficient documentation

## 2012-03-22 DIAGNOSIS — F1110 Opioid abuse, uncomplicated: Secondary | ICD-10-CM | POA: Insufficient documentation

## 2012-03-22 DIAGNOSIS — IMO0001 Reserved for inherently not codable concepts without codable children: Secondary | ICD-10-CM | POA: Insufficient documentation

## 2012-03-22 DIAGNOSIS — G43909 Migraine, unspecified, not intractable, without status migrainosus: Secondary | ICD-10-CM | POA: Insufficient documentation

## 2012-03-23 LAB — PRESCRIPTION ABUSE MONITORING 17P, URINE
Amphetamine/Meth: NEGATIVE ng/mL
Barbiturate Screen, Urine: NEGATIVE ng/mL
Benzodiazepine Screen, Urine: NEGATIVE ng/mL
Carisoprodol, Urine: NEGATIVE ng/mL
Cocaine Metabolites: NEGATIVE ng/mL
Fentanyl, Ur: NEGATIVE ng/mL
Meperidine, Ur: NEGATIVE ng/mL
Oxycodone Screen, Ur: NEGATIVE ng/mL
Tramadol Scrn, Ur: NEGATIVE ng/mL

## 2012-03-25 ENCOUNTER — Other Ambulatory Visit (HOSPITAL_COMMUNITY): Payer: Medicaid Other | Attending: Psychiatry

## 2012-03-25 DIAGNOSIS — F1110 Opioid abuse, uncomplicated: Secondary | ICD-10-CM | POA: Insufficient documentation

## 2012-03-25 DIAGNOSIS — F1520 Other stimulant dependence, uncomplicated: Secondary | ICD-10-CM | POA: Insufficient documentation

## 2012-03-25 DIAGNOSIS — G43909 Migraine, unspecified, not intractable, without status migrainosus: Secondary | ICD-10-CM | POA: Insufficient documentation

## 2012-03-25 DIAGNOSIS — IMO0001 Reserved for inherently not codable concepts without codable children: Secondary | ICD-10-CM | POA: Insufficient documentation

## 2012-03-25 NOTE — Progress Notes (Signed)
    Daily Group Progress Note  Program: CD-IOP   Group Time: 1-2:30 pm  Participation Level: Active  Behavioral Response: Appropriate  Type of Therapy: Psycho-education Group  Topic: "The Relapse Process and Interrupting It": a presentation was provided by the Medical Director, AW, and me on the relapse process and how one can interrupt or stop it. The process includes: trigger, thoughts, cravings, and use. Anywhere along that continuum, the process can be stopped, but the closer to using one gets, the more difficult it can become. One member discussed how he had been triggered by a radio ad about a [place] [employer] and their variety of tequila drinks. He admitted he was driving here to group and the ad had him experiencing cravings. Other members provided examples. The bulk of the session was spent identifying ways to disrupt this process and the importance of talking and sharing one's experience with others. There was good feedback and discussion among the group members.   Group Time: 2:45- 4pm  Participation Level: Active  Behavioral Response: Sharing  Type of Therapy: Process Group  Topic: Motivation for Recovery and Graduation: the second half of group was spent discussing a handout on the reasons for entering treatment? Members identified all sorts of reasons to stop using, including to avoid death, loss of family, loss of employment, and homelessness. Members seemed consistent in noting that their reasons for staying in treatment have grown since they first entered treatment. As the session neared the end, a celebration for a successfully graduating member was observed. There were kind words and cupcakes to celebrate this occasion. The member admitted that the program had been very helpful and she would miss the camaraderie and support of the group.   Summary: the patient was attentive and agreed about trying to stop the addiction before one gets too far down the path to using.  She reported she was very active in NA and had a sponsor and she doesn't hesitate to contact her or others when she is getting those bad thoughts. The patient reported in that the legal ramifications of her drug use is a big factor and the fact is she could easily have her 92 yo daughter taken from her custody if she doesn't change things. The patient displays a good understanding of her recovery needs. She made some good comments.    Family Program: Family present? No   Name of family member(s):   UDS collected: Yes Results: negative  AA/NA attended?: YesTuesday, Thursday and Saturday  Sponsor?: Yes   Skylinn Vialpando, LCAS

## 2012-03-27 ENCOUNTER — Other Ambulatory Visit (HOSPITAL_COMMUNITY): Payer: Medicaid Other | Admitting: Psychology

## 2012-03-28 NOTE — Progress Notes (Signed)
    Daily Group Progress Note  Program: CD-IOP   Group Time: 1-2:30 pm  Participation Level: Active  Behavioral Response: Appropriate  Type of Therapy: Activity Group  Topic: "Draw Your Life": An activity was provided in the first half of group with paper and crayons, pencils and colored markers distributed among group members. The assignment was to draw 4 pictures that represent: 1) what your life looked like 10 years ago, 2) what your life looked like 1 year ago, 3) what it looks like now, and 4) what you intend it to look like 5 years from now. The room was quite while members worked on the assignment. When all members were done drawing, they volunteered to stand up before the group and explain their drawings. The pictures were very moving and, in every case, the pictures of their current lives were painful and lonely. In each instance, the future pictures were hopeful and displayed more joy and fulfillment. The exercise provided very insightful and touched a part of each member not usually displayed in session.  Group Time: 2:45- 4pm  Participation Level: Active  Behavioral Response: Sharing  Type of Therapy: Process Group  Topic:Group process: members shared about their current issues and concerns. Some of the members were asked to talk more about the pictures they had shown in the first part of group, which invited them to share more about their lives. There was a new group member present and although the session ended before she could talk about her purpose in being in group, she seemed very relaxed and open about her own life and the drawings she had made. She assured the group she would return on Friday.    Summary: The patient reported she was doing well and had attended a meeting yesterday. She apologized for having missed group on Monday, but explained that her mother had to go into work unexpectedly, and she had no one to care for her 34 yo while she was to be in group. The  patient identified herself 10 years ago as just starting to slide down into her addiction, how a year ago she was miserable and in all kinds of trouble, how she is getting better now, and hopes to be on her own with her daughter and doing well in 5 years. The patient shared a little about her fibromyalgia and the symptoms that she struggles with. She reported that she had, at one time, been in great shape and very active, but then begin to develop symptoms of this illness. When she became pregnant, all of the symptoms went away, but gradually returned after she had her daughter. The patient made some good comments and responded well to this intervention.   Family Program: Family present? No   Name of family member(s):   UDS collected: No Results:  AA/NA attended?: YesTuesday  Sponsor?: Yes   Bridey Brookover, LCAS

## 2012-03-29 ENCOUNTER — Other Ambulatory Visit (HOSPITAL_COMMUNITY): Payer: Medicaid Other | Admitting: Psychology

## 2012-04-01 ENCOUNTER — Other Ambulatory Visit (HOSPITAL_COMMUNITY): Payer: Medicaid Other | Admitting: Psychology
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
## 2012-04-02 NOTE — Progress Notes (Signed)
    Daily Group Progress Note  Program: CD-IOP   Group Time: 1-2:30 pm  Participation Level: Active  Behavioral Response: Appropriate  Type of Therapy: Psycho-education Group  Topic: Anger and Resentments: first half of group was spent on a presentation about anger and how resentments build up when anger is not addressed. Group members were provided a handout identifying the development of resentments and the destructive effects of resentments on the individual. Group members were asked to consider that they might be intentionally holding on to their resentments and they offered various reasons they might be holding on. At least two members discussed the resentments they had from work and co-workers and they agreed that these fueled their alcohol and drug use.   Group Time: 2:45-4pm  Participation Level: Active  Behavioral Response: Appropriate  Type of Therapy: Process Group  Topic: Group process and Introduction: the second half of group was spent in process. Members discussed issues and concerns that they are dealing with in early recovery. A new group member introduced himself and shared some of the details of his addiction to Ambien and his ongoing problems with his wife. There was good disclosure and feedback with members offering examples of how they had dealt with certain problems.  Summary: The patient agreed that having been raised in an alcoholic household, she became a "pleaser" and more attuned to what others were feeling rather than her own feelings. As a result, anger was not addressed and it subsequently developed into resentments. She also reminded the group that living with her mother, a very controlling woman, makes it very difficult to address differences without a yelling match. The patient reported she is doing everything for her probation and had attended a meeting this weekend. She continues to address her mood disorder with the medical director and remains  abstinent.    Family Program: Family present? No   Name of family member(s):   UDS collected: No Results:   AA/NA attended?: YesSaturday and Sunday  Sponsor?: Yes   Lashandra Arauz, LCAS

## 2012-04-03 ENCOUNTER — Other Ambulatory Visit (HOSPITAL_COMMUNITY): Payer: Medicaid Other | Admitting: Psychology

## 2012-04-05 ENCOUNTER — Other Ambulatory Visit (HOSPITAL_COMMUNITY): Payer: Medicaid Other

## 2012-04-05 ENCOUNTER — Encounter (HOSPITAL_COMMUNITY): Payer: Self-pay | Admitting: Psychology

## 2012-04-05 NOTE — Progress Notes (Signed)
    Daily Group Progress Note  Program: CD-IOP   Group Time: 1-2:30 pm  Participation Level: Active  Behavioral Response: Appropriate  Type of Therapy: Psycho-education Group  Topic:Forgiveness: learning to let go of those resentments; first part of group was spent discussing the way to forgive. In the previous session, the role of resentments in relapse had been clearly identified and members were encouraged to consider what the resentments do for them? They had been hard pressed to identify the benefits despite holding onto them. Today, the group learned that forgiveness is a gift one gives to his/her self. Handouts were provided and discussed, including a prayer of forgiveness, Dani Gobble 3 forgiveness writing assignments along with the piece entitled, "Anyway", by Mother Rosey Bath. Members seemed surprised to consider that forgiveness is given, in many instances, to free the one offended and not the offender.   Group Time: 2:45- 4pm  Participation Level: Active  Behavioral Response: Sharing  Type of Therapy: Process Group  Topic: Group Process and Introduction: second half of group was spent in process. Members discussed their current issues and concerns in early recovery. They identified what they are doing and these disclosures also highlighted what needs to be done. A new member joined the group today and he spent some time discussing what he has come to treatment for and details of his addiction, including a daily opiate habit for the past 2 and  years. He received a warm welcome and noted that he was surprised how comfortable he had felt in the first session.   Summary: The patient admitted she has lots of anger and resentment towards her mother with whom she currently lives. The patient's parent's divorced, but not until she was in college and her father was an alcoholic and her mother was constantly nagging and verbally challenging him. It had been very chaotic. The mother  controls her daughter and granddaughter and it is a very critical environment. She seemed to like the handouts about forgiveness. In process, she reported that her daughter will be starting school soon and she is pleased about that. The patient reported she would be attending some 12-step meetings and working with her sponsor over the weekend. She remains abstinent and appears to be doing well in her recovery.    Family Program: Family present? No   Name of family member(s):   UDS collected: No Results:   AA/NA attended?: YesTuesday, Thursday and Saturday  Sponsor?: Yes   Jacquelynne Guedes, LCAS

## 2012-04-07 NOTE — Progress Notes (Signed)
Patient ID: Shelly Cummings, female   DOB: 1976-07-06, 36 y.o.   MRN: [id number] The patient phoned and reported she would not be in group today. She reported that her 54 yo daughter, [name], has some kind of stomach virus and she will be taking her to the doctor. She apologized and expressed her disappointment in not being there for the graduation for her fellow group member. I agreed to excuse her from group and she assured me she would be in group on Monday.

## 2012-04-08 ENCOUNTER — Other Ambulatory Visit (HOSPITAL_COMMUNITY): Payer: Medicaid Other | Admitting: Psychology

## 2012-04-08 DIAGNOSIS — F1920 Other psychoactive substance dependence, uncomplicated: Secondary | ICD-10-CM

## 2012-04-09 LAB — PRESCRIPTION ABUSE MONITORING 17P, URINE
Amphetamine/Meth: NEGATIVE ng/mL
Barbiturate Screen, Urine: NEGATIVE ng/mL
Cannabinoid Scrn, Ur: NEGATIVE ng/mL
Carisoprodol, Urine: NEGATIVE ng/mL
Cocaine Metabolites: NEGATIVE ng/mL
Fentanyl, Ur: NEGATIVE ng/mL
Opiate Screen, Urine: NEGATIVE ng/mL
Tapentadol, urine: NEGATIVE ng/mL
Tramadol Scrn, Ur: NEGATIVE ng/mL
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
## 2012-04-10 ENCOUNTER — Other Ambulatory Visit (HOSPITAL_COMMUNITY): Payer: Medicaid Other

## 2012-04-12 ENCOUNTER — Other Ambulatory Visit (HOSPITAL_COMMUNITY): Payer: Medicaid Other | Admitting: Psychology

## 2012-04-14 NOTE — Progress Notes (Signed)
    Daily Group Progress Note  Program: CD-IOP   Group Time: 1-2:30 pm  Participation Level: Active  Behavioral Response: Appropriate  Type of Therapy: Psycho-education Group  Topic: The 12-Step community and its importance in early recovery: first half of group was spent in a session with information being provided about the origin of Alcoholics Anonymous and the various aspects of the 12-step recovery movement. A brief scene from "My Name is Francisco Capuchin" was shown and the group saw the first time Dr. Nadine Counts and Francisco Capuchin met. Handouts were provided detailing the different types of meetings as well as specifics about sponsorship and how one might go about securing a sponsor. Group members provided their own feedback and encouraged those members who have not attended any meetings to please go. There was good discussion and disclosure about meetings and how they were helpful in providing ongoing support for sobriety.  Group Time: 2:45- 4pm  Participation Level: Active  Behavioral Response: Sharing  Type of Therapy: Process Group  Topic: Group Process and Introductions: second half of group was spent in process. There were 2 new group members and they shared about their own addiction, what led them to treatment, and what they hope to get out of this program. There was good feedback and the commonalities among members did not go unnoticed. All of the members present agreed that they would attend a 12-step meeting between today and the next group session on Monday.   Summary: The member was attentive and stated she was doing well. She apologized for having missed group on Wednesday and explained she had been in court. The patient stated the court had been continued, but next time they meet in September, they should resolve the issue and she should be able to get her license back. The patient expressed her appreciation of the 12-step community and noted she has a sponsor and is active in NA and AA. She  attends about 4 meetings per week in addiction to this program. She is somewhat limited because she currently has a curfew and must be home by 7 pm so she misses a lot of the evenings meetings. The patient was attentive and provided good feedback to the new group members. She remains abstinent and is doing well in her recovery. The patient requested a refill and this request was forwarded to the medical director who was attending a training and not in the office today. She is doing well in her recovery.    Family Program: Family present? No   Name of family member(s):   UDS collected: No Results:   AA/NA attended?: YesTuesday, Thursday, Saturday and Sunday  Sponsor?: Yes   Claxton Levitz, LCAS

## 2012-04-15 ENCOUNTER — Other Ambulatory Visit (HOSPITAL_COMMUNITY): Payer: Medicaid Other | Admitting: Psychology

## 2012-04-16 ENCOUNTER — Other Ambulatory Visit (HOSPITAL_COMMUNITY): Payer: Self-pay | Admitting: Physician Assistant

## 2012-04-16 MED ORDER — DULOXETINE HCL 60 MG PO CPEP: 120.0000 mg | ORAL_CAPSULE | Freq: Every morning | ORAL | Status: DC

## 2012-04-16 NOTE — Progress Notes (Signed)
    Daily Group Progress Note  Program: CD-IOP   Group Time: 1-2:30 pm  Participation Level: Active  Behavioral Response: Appropriate  Type of Therapy: Psycho-education Group  Topic: Loss and Grief: A guest facilitator was present for group today and introduced herself and provided a little bit of her history. Following the check-in, a psycho-educational piece was provided focusing on the role that grief and loss can play in recovery. Members discussed how their pain and grief has led to drinking and drugging. A discussion ensued about ways members might address their grief or, if necessary, distract one's self away from the pain for a while. Everyone in the group seemed to be dealing with some sort of loss and the topic was very pertinent to recovery.    Group Time: 2:45- 4pm  Participation Level: Active  Behavioral Response: Sharing  Type of Therapy: Process Group  Topic: Process: second half of group was spent in process. Members discussed current issues in their lives and stressors that are proving difficult to deal with. There were 2 new group members and they introduced themselves briefly. There was good disclosure and feedback among the group. Per their reports, all group members remained alcohol and drug-free over the weekend.   Summary: Patient reported maintaining sobriety over the weekend. Group focused on identifying significant losses, clarifying the grief process, and how unresolved grief contributes to addiction and relapse.  Pt. shared loss of identity and self-confidence and guilt for loss of daughter's innocence due to exposing daughter to her drug use.Patient shared concerns about crying in her daughter's presence. Pt. was encouraged by the group to process emotions with her daughter so that she can learn range of emotions and healthy coping behaviors.  Family Program: Family present? No   Name of family member(s):   UDS collected: No Results:  AA/NA attended?:  YesTuesday, Thursday and Saturday  Sponsor?: Yes   Sevan Mcbroom, LCAS

## 2012-04-17 ENCOUNTER — Other Ambulatory Visit (HOSPITAL_COMMUNITY): Payer: Medicaid Other | Admitting: Psychology

## 2012-04-17 DIAGNOSIS — F1920 Other psychoactive substance dependence, uncomplicated: Secondary | ICD-10-CM

## 2012-04-18 LAB — PRESCRIPTION ABUSE MONITORING 17P, URINE
Cannabinoid Scrn, Ur: NEGATIVE ng/mL
Cocaine Metabolites: NEGATIVE ng/mL
Creatinine, Urine: 173.66 mg/dL (ref >20.0)
MDMA URINE: NEGATIVE ng/mL
Methadone Screen, Urine: NEGATIVE ng/mL
Opiate Screen, Urine: NEGATIVE ng/mL
Tapentadol, urine: NEGATIVE ng/mL
Tramadol Scrn, Ur: NEGATIVE ng/mL

## 2012-04-18 NOTE — Progress Notes (Signed)
    Daily Group Progress Note  Program: CD-IOP   Group Time: 1-2:30 pm  Participation Level: Active  Behavioral Response: Appropriate  Type of Therapy: Activity Group  Topic: Stress, recognizing it in your body, and learning to relax. First part of group was spent in a guided progressive relaxation session. The lights were dimmed and members sat comfortably as I read a progressive relaxation exercise. After the exercise concluded, members were asked to share their experiences of the guided exercise. It was emphasized that for the group members, there will always be some sort of stressors in their lives and, in early recovery, they must be particularly aware of these stressful times. Their external circumstances may not change, but it is our intention that their responses and reactions to those instances will be dramatically different in early recovery. During the check-in, the group applauded 2 members who have recently achieved 30 days of sobriety.  Group Time: 2:45- 4pm  Participation Level: Active  Behavioral Response: Sharing  Type of Therapy: Process Group  Topic: Group Process; members shared about their struggles and issues in early recovery. There was good feedback about procrastination and job seeking. One member talked about her 'shame' and noted this topic had been discussed in the previous session and had proved helpful. One member apologized as the group session neared the end. He apologized for not having been very engaged and admitted he had been extremely depressed for the past 3-4 days. The member stated he had an appointment to meet with his psychiatrist tomorrow. I thanked him for this disclosure as he has shared very little of himself in sessions and been instructed repeatedly to open up to the group.  Summary: The patient reported she was under a lot of stress and shared the difficulties of living with a controlling mother. She also agreed with another member about the  previous session and noted she feels a lot of shame and guilt because of what her 18 yo daughter has had to go through with her addiction. She reported that she felt very relaxed during the activity and will insure that she practices breathing and the progressive relaxation at home. She reported she continues to meet with her probation officer and remains compliant relative to all of her requirements for probation and this program.    Family Program: Family present? No   Name of family member(s):   UDS collected: Yes Results: negative  AA/NA attended?: YesTuesday, Thursday, Saturday and Sunday  Sponsor?: Yes   Agape Hardiman, LCAS

## 2012-04-19 ENCOUNTER — Other Ambulatory Visit (HOSPITAL_COMMUNITY): Payer: Medicaid Other | Admitting: Psychology

## 2012-04-24 ENCOUNTER — Other Ambulatory Visit (HOSPITAL_COMMUNITY): Payer: Medicaid Other | Attending: Psychiatry | Admitting: Psychology

## 2012-04-24 DIAGNOSIS — F1920 Other psychoactive substance dependence, uncomplicated: Secondary | ICD-10-CM | POA: Insufficient documentation

## 2012-04-24 DIAGNOSIS — IMO0001 Reserved for inherently not codable concepts without codable children: Secondary | ICD-10-CM | POA: Insufficient documentation

## 2012-04-24 DIAGNOSIS — F319 Bipolar disorder, unspecified: Secondary | ICD-10-CM | POA: Insufficient documentation

## 2012-04-24 NOTE — Progress Notes (Signed)
    Daily Group Progress Note  Program: CD-IOP   Group Time: 1-2:30 pm  Participation Level: Active  Behavioral Response: Appropriate  Type of Therapy: Psychoeducation  Topic: Forgiveness: A presentation was provided on forgiveness and what that includes. The group discussed the need to identify boundaries, acceptance, acknowledgement of the pain associated with the wrong. Included was the recognition of how our barriers to forgiveness of self and others can contribute to relapse. There was good sharing and disclosures among the group. Prior to the break, the therapist led the group in a progressive relaxation exercise.   Group Time: 2:345- 4pm  Participation Level: Active  Behavioral Response: Sharing  Type of Therapy: Process Group  Topic:Process: second half of group spent in process. Members discussed issues and concerns around recovery and everyone maintained that they had remained abstinent. Included in this half of group was a discussion on plans for the upcoming holiday and how members intended to remain sober. While some had very definite plans around 12-steps and sponsors, other members' plans were very uncertain and they were encouraged to make a plan before the weekend arrived.    Summary: Patient reported maintaining sobriety since Wednesday's group. Participated in progressive muscle relaxation exercise. Group focused on the role of forgiveness of self and others in recovery. Pt. shared that she struggles with forgiveness of herself and forgiving her mother.  Family Program: Family present? No   Name of family member(s):   UDS collected: No Results:   AA/NA attended?: YesTuesday, Thursday and Sunday  Sponsor?: Yes   Jocabed Cheese, LCAS

## 2012-04-25 LAB — PRESCRIPTION ABUSE MONITORING 17P, URINE
Buprenorphine, Urine: NEGATIVE ng/mL
Cannabinoid Scrn, Ur: NEGATIVE ng/mL
Creatinine, Urine: 158.32 mg/dL (ref >20.0)
Methadone Screen, Urine: NEGATIVE ng/mL
Opiate Screen, Urine: NEGATIVE ng/mL
Propoxyphene: NEGATIVE ng/mL
Tapentadol, urine: NEGATIVE ng/mL

## 2012-04-25 LAB — ALCOHOL METABOLITE (ETG), URINE: Ethyl Glucuronide (EtG): NEGATIVE ng/mL

## 2012-04-26 ENCOUNTER — Other Ambulatory Visit (HOSPITAL_COMMUNITY): Payer: Medicaid Other | Admitting: Psychology

## 2012-04-28 NOTE — Progress Notes (Signed)
    Daily Group Progress Note  Program: CD-IOP   Group Time: 1-2:30 pm  Participation Level: Active  Behavioral Response: Appropriate  Type of Therapy: Process Group  Topic: Group Process: first half of group was spent in check-in and process. Members shared about the past holiday weekend and 2 members admitted they had used since the last session. After their slips were discussed and reviewed, members talked about their struggles and issues in early recovery. Two new group members were present and they also checked-in. One member was asked to share about his alcohol/drug use and he was very open about his addiction and other health problems. Random drug tests were collected and the session included good disclosure and feedback within the group.  Group Time: 2:45- 4pm  Participation Level: Active  Behavioral Response: Sharing  Type of Therapy: Psycho-education Group  Topic: "Communication: Assertive Communication and what that looks like". Second half of this group session was spent in a psycho educational session focused on assertive communication. The session included a handout and role-playing among group members. The importance of being assertive and getting one's needs met was discussed with members sharing about their own cooperation in assuming a diminished role and acceptable treatment among loved ones. There was good feedback among the group.  Summary: The patient reported she had had a good weekend and attended several AA meetings and is in contact with her sponsor. She reported she spent most of her time with her 90 yo daughter. The patient agreed with another member about sharing what one is genuinely  feeling because sometimes that information can be used against you. She expressed frustration with her mother and the problems they have communicating. When Juliana tries to talk with her mother, it usually ends in voices raised and her mother getting angry. We discussed how Kimberlee  might work on addressing her own buttons and learn how not to respond to her mother because her mother really pushes all the buttons. The patient made an excellent realization that her mother was not going to change and that she was the one who would have to change to accommodate her difficult mother.    Family Program: Family present? No   Name of family member(s):   UDS collected: Yes Results: negative  AA/NA attended?: YesMonday, Tuesday, Saturday and Sunday  Sponsor?: Yes   Kamauri Denardo, LCAS

## 2012-04-29 ENCOUNTER — Other Ambulatory Visit (HOSPITAL_COMMUNITY): Payer: Medicaid Other

## 2012-04-29 NOTE — Progress Notes (Signed)
    Daily Group Progress Note  Program: CD-IOP   Group Time: 1-2:30 pm  Participation Level: Active  Behavioral Response: Appropriate  Type of Therapy: Process Group  Topic: Group Process: first part of group was spent in process. There was a new group member present and during this half of group, she shared about her drinking and desires to quit. Members shared their frustrations in early recovery with family, friends and circumstances. The group was reminded that they are responsible for the choices they make. One member was present for his second session and he shared a little about his opiate use and anger about being here. He admitted his parents are making him come and he is also angry because he is not allowed to take his Adderall while in this program. His fellow group members shared their experiences, specifically about how their disease progressed and that they had recognized they couldn't do it themselves. There was good feedback and disclosure among members.  Group Time: 2:45- 4pm  Participation Level: Active  Behavioral Response: Sharing  Type of Therapy: Activity Group  Topic: Activity: in second part of group members were asked to identify 2 people that they admire and to identify 2-3 qualities that they possess for which they are admired. After being provided with a few minutes to consider this assignment, members were asked to share about their answers. Every member shared that they admired a family member or friend. They also identified the characteristics or traits that they admired about them. As the activity progress, I pointed out that these are traits and qualities that each group member values. This exercise invites participants to recognize traits and qualities that they admire and value. This activity aids members in identifying the values they consider important and is used to stimulate reflection and direction towards living out their values.   Summary: The  patient reported she is doing well. Remains compliant with probation and noted she needed to sign a consent allowing me to speak with her new probation officer in [facility]. She reported she is working the steps with her sponsor and feels good about herself. She identifyed her mother and her daughter as two people she admires. She noted her mother was organized and independent while her daughter was smart curious and kind. One wonders if she knows anyone else to admire since she has complained repeatedly about her mother and her daughter is only 34 yo. This member has remained sober with a sobriety date of May 26. Will continue to follow closely.    Family Program: Family present? No   Name of family member(s):   UDS collected: No Results:   AA/NA attended?: YesTuesday, Thursday, Saturday and Sunday  Sponsor?: Yes   Tait Balistreri, LCAS

## 2012-05-01 ENCOUNTER — Other Ambulatory Visit (HOSPITAL_COMMUNITY): Payer: Medicaid Other | Admitting: Psychology

## 2012-05-01 DIAGNOSIS — F1920 Other psychoactive substance dependence, uncomplicated: Secondary | ICD-10-CM

## 2012-05-02 ENCOUNTER — Other Ambulatory Visit (HOSPITAL_COMMUNITY): Payer: Self-pay | Admitting: *Deleted

## 2012-05-02 LAB — PRESCRIPTION ABUSE MONITORING 17P, URINE
Benzodiazepine Screen, Urine: NEGATIVE ng/mL
Buprenorphine, Urine: NEGATIVE ng/mL
Creatinine, Urine: 121.66 mg/dL (ref >20.0)
Meperidine, Ur: NEGATIVE ng/mL
Methadone Screen, Urine: NEGATIVE ng/mL
Propoxyphene: NEGATIVE ng/mL

## 2012-05-02 LAB — ALCOHOL METABOLITE (ETG), URINE: Ethyl Glucuronide (EtG): NEGATIVE ng/mL

## 2012-05-02 NOTE — Progress Notes (Signed)
    Daily Group Progress Note  Program: CD-IOP   Group Time: 1-2:30 pm  Participation Level: Active  Behavioral Response: Appropriate  Type of Therapy: Psycho-education Group  Topic: Self-Esteem: How Negative Core Beliefs Develop and are Maintained. A presentation was provided examining the development of one's self-esteem and how negative core beliefs can develop. Handouts were provided to group members. Self-esteem is formed in childhood and is influenced by many different experiences one has in her/his world. The formation of negative core beliefs and subsequent development of rules and behaviors to protect one from the activation of those beliefs was charted on the board. Members shared about their early experiences and the beliefs about themselves that developed as results of those experiences. The patterns of behaviors that many group members have followed based on those core beliefs were highlighted and made perfect sense to those reporting them. The session proved enlightening to many. Friday's group session will focus on how one can begin to change those core beliefs.   Group Time: 2:45- 4pm  Participation Level: Active  Behavioral Response: Sharing  Type of Therapy: Process Group  Topic: Group Process: second half of group was spent in process. Two members admitted they had relapsed with one member explaining she had lied about it all week and had felt very guilty keeping the truth from the group. The other member seemed very discouraged and questioned whether she would ever stop using. Her disclosures included childhood experiences that served to form her negative core beliefs about self-worth and value. There was good disclosure among group members with very affirming feedback to those struggling.   Summary: the patient reported she is doing well and remains drug-free. She reported she is working to try and teach her mother to be more positive about life. The patient explained  that her mother is always very negative and pessimistic and she is trying to be more hopeful about her life and future and feels as if both she and her daughter will benefit if her mother will also embrace a more hopeful and enthusiastic attitude towards life. Thea patient stated that she could understand how a negative self-esteem can develop. She pointed out that her sense of self improved dramatically after she was prescribed Adderall in high school and her grades improved markedly and she was on the cheerleading squad. The patient continues to make good progress in her recovery and provided good feedback to fellow group members.    Family Program: Family present? No   Name of family member(s):   UDS collected: Yes Results: not yet back  AA/NA attended?: YesTuesday, Thursday, Saturday and Sunday  Sponsor?: Yes   Keni Wafer, LCAS

## 2012-05-02 NOTE — Telephone Encounter (Signed)
CDIOP participant. Medication last ordered 7/26 by Jorje Guild, PA

## 2012-05-03 ENCOUNTER — Other Ambulatory Visit (HOSPITAL_COMMUNITY): Payer: Medicaid Other | Admitting: Psychology

## 2012-05-03 MED ORDER — TOPIRAMATE 25 MG PO TABS: ORAL_TABLET | ORAL | Status: DC

## 2012-05-03 NOTE — Progress Notes (Unsigned)
Patient ID: Shelly Cummings, female   DOB: [birth year], 36 y.o.   MRN: [id number]   Subjective: [name] requested to be seen for a medication management session. She reports that her fibromyalgia has been bothering her more recently. She does get relief by walking, and she reports that she has been walking every morning. She will add other walks if she needs the extra relief from fibromyalgia. She complains that she does have some mild gastric upset from the Cymbalta. She reports that on occasions when she has missed her dose of Lamictal, she feels better the next day. She feels that the Lamictal causes her to feel slow. She feels that the Abilify has made a huge difference, and she feels less anxious and less depressed since she has been on that. She asked if she could be put on Topamax, as she has been on in the past for her migraine prophylaxis, and she knows that it has some appetite suppressant properties.  Objective: Well groomed well nourished white female, fully alert and oriented, in no acute distress, with a euthymic mood and appropriate affect.  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  We will add Topamax beginning at 25 mg twice daily and titrate up by adding 25 mg daily each week, until a target dose of 100 mg twice daily is reached. We will discontinue the Lamictal by decreasing the dose by half for one week then discontinuing altogether. She will continue on the Abilify 2.5 mg daily, Cymbalta 120 mg daily, and trazodone 100 mg at bedtime. She has been taking Neurontin 1200 mg on an as-needed basis for anxiety and pain relief. We will allow this.

## 2012-05-05 NOTE — Progress Notes (Signed)
    Daily Group Progress Note  Program: CD-IOP   Group Time: 1-2:30 pm  Participation Level: Active  Behavioral Response: Appropriate  Type of Therapy: Psycho-education Group  Topic: Positive Qualities Record: Beginning to Challenge the Negative Core Belief: first half of group spent in presentation about challenging one's negative core beliefs. The first step requires one identifying their "positive qualities". Group members were provided with a handout that included 12 different questions which required members to identify positive aspects of themselves. Members confirmed that they understood the development of negative core beliefs, but were eager to learn how to begin to change that. There was good disclosure and conversation among the group and all were able to identify positive qualities and characteristics that they possess.  Group Time: 2:45- 4pm  Participation Level: Active  Behavioral Response: Sharing  Type of Therapy: Process Group  Topic: Group Process/Graduation: the second half of group was spent in process. Members talked about their current frustrations and difficulties in early recovery. There was good feedback and sharing about how they have dealt with these commonly held experiences. As the session came to an end, a graduation ceremony was held for a successfully graduating member. There were brownies and kind words about the graduating member. The session proved very effective for all present.  Summary: The patient reported she is genuine, loyal, and very empathetic. She admitted she felt embarrassed and inadequate because she struggles with her mental illness, obesity and addiction. In process, the patient described in detail how her fibromyalgia has been more active recently and how difficult it has proven for her. She shared kind words about the graduating member and noted that they have become friends and talk or text frequently. The patient received good feedback  and responded well to this intervention.    Family Program: Family present? No   Name of family member(s):   UDS collected: No Results:   AA/NA attended?: YesTuesday, Thursday, Saturday and Sunday  Sponsor?: Yes   Ly Wass, LCAS

## 2012-05-06 ENCOUNTER — Other Ambulatory Visit (HOSPITAL_COMMUNITY): Payer: Medicaid Other | Admitting: Psychology

## 2012-05-06 ENCOUNTER — Encounter (HOSPITAL_COMMUNITY): Payer: Self-pay | Admitting: Psychology

## 2012-05-06 DIAGNOSIS — F1920 Other psychoactive substance dependence, uncomplicated: Secondary | ICD-10-CM

## 2012-05-07 LAB — PRESCRIPTION ABUSE MONITORING 17P, URINE
Amphetamine/Meth: NEGATIVE ng/mL
Barbiturate Screen, Urine: NEGATIVE ng/mL
Benzodiazepine Screen, Urine: NEGATIVE ng/mL
Buprenorphine, Urine: NEGATIVE ng/mL
Carisoprodol, Urine: NEGATIVE ng/mL
Creatinine, Urine: 109.72 mg/dL (ref >20.0)
Fentanyl, Ur: NEGATIVE ng/mL
MDMA URINE: NEGATIVE ng/mL
Propoxyphene: NEGATIVE ng/mL
Tapentadol, urine: NEGATIVE ng/mL
Tramadol Scrn, Ur: NEGATIVE ng/mL
Zolpidem, Urine: NEGATIVE ng/mL

## 2012-05-07 LAB — ALCOHOL METABOLITE (ETG), URINE: Ethyl Glucuronide (EtG): NEGATIVE ng/mL

## 2012-05-07 NOTE — Progress Notes (Signed)
Patient ID: MICKALA LATON, female   DOB: 10-22-1975, 36 y.o.   MRN: [id number] I received a voice mail message from Keita Demarco, Daniella's mother. She reported she had some things she wanted to tell me and asked that I return the call. I returned the call and the patient's mother expressed her appreciation for having returned her call. She noted that her daughter had been acting unusually happy over the weekend and she was questioning whether she had used. Rosalita Chessman noted that her daughter had received a check for child support for $1,000 earlier in the week and she was concerned that her daughter might be planning to buy drugs. She expressed concern about her daughter and wondered what she would do when the CD-IOP program ended. She noted she is not attending any NA or AA meetings. I found this news surprising since Cimberly reports in group that she is attending between 3- 4 meetings per week and working with her sponsor. When I asked about Blakley's sponsor, Rosalita Chessman reported Shelitha had stopped working with her a long time ago. I asked about her exercise regiment? Shenetta has reported she walks in the early mornings and has found it very helpful both physically and emotionally. Rosalita Chessman laughed and reported that her daughter does not exercise and, to her knowledge, has walked twice to the [employer], but not for exercise. All of this information was very confusing and quite contrary to what this patient reports. When I asked what her daughter does all day, Rosalita Chessman reported she sleeps and stays up in her room. She noted that her room is a complete mess with stuff pilled up almost 2 feet off the floor. I thanked Rosalita Chessman for sharing this information with me and agreed that we would talk again. Will discuss this with the treatment team and determine how to proceed.

## 2012-05-07 NOTE — Progress Notes (Signed)
    Daily Group Progress Note  Program: CD-IOP   Group Time: 1-2:30 pm  Participation Level: Active  Behavioral Response: Appropriate  Type of Therapy: Process Group  Topic: Group Process: first half of group was spent in process. Members recounted the events of the past weekend and described things they had done to support their recovery. While a few members attended numerous meetings, others had not gone to any 12-step meetings or done anything of significance focusing on their new life of sobriety. The chronic and progressive nature of this disease was reiterated and members encouraged to make a strong commitment to abstinence and doing something at least once every day. Random drug tests were collected from 3 group members.  Group Time: 2:45- 4pm  Participation Level: Active  Behavioral Response: Sharing  Type of Therapy: Psycho-education Group  Topic: Trigger, Thought, Cravings, and Use: the relapse process and how to disrupt it. Second half of group was spent reviewing the relapse process and how one must interrupt this path towards relapse. One member had relapses last weekend and the process of her relapse mapped on the board. Members identified where she could have stopped this process and remains sober instead of smoking crack. The importance of "telling on yourself" was emphasized with members giving their own examples from the past. There was good disclosure among the group and positive feedback.  Summary: The patient checked in with the same sobriety date of 5/26. She reported she had had a good weekend. The patient explained she had gone for a long walk with her daughter and they had gone to the [employer]. On Sunday she noted she had gone to her home group and met with her sponsor. She reported she is doing well and feels good about herself. As the session progressed and members shared about their weekend, I returned to the patient and reported her mother  had phoned and spoken with me this morning. I pointed out she had told me that Aleiya was not attending any 12-step meetings and no longer has a sponsor. This directly conflicted with the patient's report that she had, indeed, attended a meeting on Sunday. She seemed undaunted and quickly responded that her mother is always making things up and trying to sabotage her recovery. "When things are going well, she always tries to mess things up", reported the patient. I noted some other things reported to me and the patient became teary and described her mother as controlling and extremely cruel and critical of her. She recounted the mean things she has said to her and cried. The group seemed very upset and concerned about this member and agreed that living with someone like that was awful. We moved onto another topic after this exchange. In the second half of group, the patient agreed with another member when discussing the relapse process and how one must address the dysfunctional addictive thinking immediately when it happens. The patient displayed a good understanding of her recovery and the importance of a daily recovery plan.   Family Program: Family present? No   Name of family member(s):   UDS collected: Yes Results: Not yet returned  AA/NA attended?: YesTuesday, Thursday and Sunday, per patient's report  Sponsor?: Yes, per patient's report   Jaicey Sweaney, LCAS

## 2012-05-08 ENCOUNTER — Encounter (HOSPITAL_COMMUNITY): Payer: Self-pay | Admitting: Psychology

## 2012-05-08 ENCOUNTER — Other Ambulatory Visit (HOSPITAL_COMMUNITY): Payer: Medicaid Other

## 2012-05-08 ENCOUNTER — Other Ambulatory Visit (HOSPITAL_COMMUNITY): Payer: Self-pay | Admitting: *Deleted

## 2012-05-08 NOTE — Telephone Encounter (Signed)
Currently in CDIOP

## 2012-05-09 ENCOUNTER — Encounter (HOSPITAL_COMMUNITY): Payer: Self-pay | Admitting: Psychology

## 2012-05-09 NOTE — Progress Notes (Signed)
Patient ID: Shelly Cummings, female   DOB: 1975-11-24, 36 y.o.   MRN: [id number] I phoned the patient's sponsor this morning. [name] had signed a consent allowing me to speak with [name]. The patient was caught in a lie on Monday about her 12-step meeting attendance and I had asked is she would be willing to allow me to speak with her sponsor? She had no problem with this request and promptly signed the documentation. There was no answer and I left a vm  message identifying myself and requesting a return call. I tried again in the afternoon with the same response. I will continue in efforts to contact this woman and discuss her sponsee who is attending our program. Will continue to follow closely.

## 2012-05-09 NOTE — Progress Notes (Signed)
Patient ID: Shelly Cummings, female   DOB: May 16, 1976, 36 y.o.   MRN: [id number] CD-IOP - Treatment Plan Update: I met with the patient today before her scheduled group session. We were meeting to review her goals for treatment, but also because there was a discrepancy between what her mother, Shelly Cummings, had told me about her daughter and what Shelly Cummings has reported. Shelly Cummings came prepared and signed a consent allowing me to speak with her new probation officer in [facility]. Her probation had been transferred from [facility] to the [employer] office. She also agreed to allow me to speak with her sponsor and signed a consent accordingly. I admitted to the patient that I wasn't sure what to believe after she had reported in group on Monday that she had gone to a meeting and met with her sponsor on Monday only to deny having done that at the end of the session as I was preparing to walk up with her to her mother's car in the parking lot and try to get some clarity about what was true. Shelly Cummings insisted that although she had lied about the meeting on Sunday, she is active in the 12-step community and does work closely with her sponsor. We discussed her success in remaining drug-free which is her first goal of treatment. The patient stated that she is building support for her recovery, but there is some questioning on my part about how many meetings she is actually attending? Her mother reported that Shelly Cummings was not attending meetings. Relative to her mental health, the patient reported she is feeling more hopeful and less depressed and is very pleased with the treatment she has received from our medical director, Shelly Cummings. Shelly Cummings reported she has scheduled to meet with Shelly Cummings once she leaves our program. She is currently prescribed: Abilify, Topomax, Cymbalta, and Trazadone. Relative to her goal for remaining compliant with probation, the patient is also making excellent progress in this area. She continues to wear her ankle bracelet and  observe the evening curfew of 7 pm. From all appearances, this patient is making excellent progress in all aspects of treatment, but the recent lie she told the group and myself has me very uncertain of what she is doing that does not include close monitoring. All documentation was reviewed and the treatment plan update completed accordingly. We will continue to monitor this patient closely in the days and weeks ahead.

## 2012-05-10 ENCOUNTER — Other Ambulatory Visit (HOSPITAL_COMMUNITY): Payer: Medicaid Other

## 2012-05-13 ENCOUNTER — Other Ambulatory Visit (HOSPITAL_COMMUNITY): Payer: Medicaid Other | Admitting: Psychology

## 2012-05-13 ENCOUNTER — Encounter (HOSPITAL_COMMUNITY): Payer: Self-pay | Admitting: Psychology

## 2012-05-15 ENCOUNTER — Encounter (HOSPITAL_COMMUNITY): Payer: Self-pay | Admitting: Psychology

## 2012-05-15 ENCOUNTER — Other Ambulatory Visit (HOSPITAL_COMMUNITY): Payer: Medicaid Other

## 2012-05-15 NOTE — Progress Notes (Signed)
Patient ID: Shelly Cummings, female   DOB: 04/06/1976, 36 y.o.   MRN: [id number] I received a message from Shelly Cummings, Shelley's mother, stating that they were in [facility] this afternoon for [name] appointment. She expresssed frustration with the medication that [name] has begun taking and noted that she acts as if she has been using. Shelly Cummings reported her daughter was very sleepy, irritable and she couldn't stand the way she was behaving. After consulting with AW, the medical director, I phoned [name] and left a message instructing her to stop the Topomax immediately. There should be no side effects from halting it immediately and continuing to take it will only make the sleepiness worse. I also phoned Shelly Cummings and left a message for her explaining that [name] has been instructed to halt the medication.

## 2012-05-15 NOTE — Progress Notes (Signed)
Patient ID: Shelly Cummings, female   DOB: May 07, 1976, 36 y.o.   MRN: [id number] I received a message from [name] stating that she would not be in group today. She explained her daughter, [name], has an appointment this afternoon with an endocrinologist at [facility] in [place]. She apologized, but stated she would be in group on Friday. I phoned and left a message for her mother asking her to confirm that the granddaughter had an appointment and we should expect [name] to be absent today.

## 2012-05-15 NOTE — Progress Notes (Incomplete)
    Daily Group Progress Note  Program: CD-IOP   Group Time: 1-2:30 pm  Participation Level: Minimal  Behavioral Response: Appropriate  Type of Therapy: Psycho-education Group  Topic: Chief Operating Officer and the Recovery Pie: first half of group spent checking-in. members shared about their recovery activities over the past weekend. While some members had attended 5 or 6 AA/NA meetings, some had not attended any meetings. It seemed perfect that the group had a guest speaker today. She had graduated successfully from the program and had attained over 8 months of sobriety. She shared about her own recovery program and what she has done to insure sobriety. The patient emphasized the importance of the 12-step community for her sobriety and reported she attends around 2 meetings per day. She calls her sponsor every day and frequently meets with others in recovery for lunch or coffee. The importance of her spiritual practice and faith are also very important on a daily basis. There was good feedback among members and a welcoming response to the guest speaker.   Group Time: 2:45- 4pm  Participation Level: Active  Behavioral Response: Sharing  Type of Therapy: Process Group  Topic: Group Process: members shared about their current struggles and concerns. The importance of attending 12-step meetings was repeated and members discussed what meetings they might be able to attend together. At least two members asked about transportation. At my instructions, one member shared that she had been lying about attending meetings and working with her sponsor and admitted she had felt very guilty about her lies. She made some excuse about having to lie to her mother about many unrelated issues and this attempt to rationalize her lying went unnoticed. The importance of attending meetings and building support was reiterated and every group member stated they would attend a meeting.   Summary: The patient was attentive  to the speaker this afternoon. In process, she admitted to the group that she has been lying about attending meetings. She reported she has learned to lie in order to protect herself from her mother and this tendency has carried over into all parts of her life. The group had little response to this disclosure. The patient reported she is doing well and will meet with her new probation officer in [facility] tomorrow. She displayed little remorse over her lying to the group and misrepresenting herself in so many ways. It is unclear what this woman needs to do to demonstrate a genuine commitment to changing her thinking, attitudes and behaviors. Her drug tests indicate she is abstinent, but little else appears to have changed.    Family Program: Family present? No   Name of family member(s):   UDS collected: Yes Results: {Findings; urine drug screen:60936}  AA/NA attended?: {BHH YES OR NO:22294}{DAYS OF ZOXW:96045}  Sponsor?: {BHH YES OR NO:22294}   Elianys Conry, LCAS

## 2012-05-15 NOTE — Progress Notes (Signed)
    Daily Group Progress Note  Program: CD-IOP   Group Time: 1-2:30 pm  Participation Level: Minimal  Behavioral Response: Drowsy  Type of Therapy: Psycho-education Group  Topic: Chief Operating Officer and the Recovery Pie: first half of group spent checking-in. members shared about their recovery activities over the past weekend. While some members had attended 5 or 6 AA/NA meetings, some had not attended any meetings. It seemed perfect that the group had a guest speaker today. She had graduated successfully from the program and had attained over 8 months of sobriety. She shared about her own recovery program and what she has done to insure sobriety. The patient emphasized the importance of the 12-step community for her sobriety and reported she attends around 2 meetings per day. She calls her sponsor every day and frequently meets with others in recovery for lunch or coffee. The importance of her spiritual practice and faith are also very important on a daily basis. There was good feedback among members and a welcoming response to the guest speaker.   Group Time: 2:45- 4pm  Participation Level: Minimal  Behavioral Response: Drowsy  Type of Therapy: Process Group  Topic: Group Process: members shared about their current struggles and concerns. The importance of attending 12-step meetings was repeated and members discussed what meetings they might be able to attend together. At least two members asked about transportation. At my instructions, one member shared that she had been lying about attending meetings and working with her sponsor and admitted she had felt very guilty about her lies. She made some excuse about having to lie to her mother about many unrelated issues and this attempt to rationalize her lying went unnoticed. The importance of attending meetings and building support was reiterated and every group member stated they would attend a meeting.   Summary: The patient reported she was  doing well, but felt very sleepy due to the new medication she has been prescribed. She reported it was Topomax and the [employer] geologist had prescribed it to her. During the visit from the guest speaker, the patient was repeatedly observed with her eyes closed and I directed the member sitting next to her to tap her on the shoulder to wake up. Later in the session, I questioned whether the patient needed to stop taking the medication, but she noted she hoped these initial symptoms would subside. She shared a little about her intention to attend a meeting, but seemed to have a conflict when it was suggested that she meet with some of the other female group members.    Family Program: Family present? No   Name of family member(s):   UDS collected: No Results:   AA/NA attended?: No  Sponsor?: No   Sharlett Lienemann, LCAS

## 2012-05-16 ENCOUNTER — Other Ambulatory Visit (HOSPITAL_COMMUNITY): Payer: Medicaid Other

## 2012-05-17 ENCOUNTER — Other Ambulatory Visit (HOSPITAL_COMMUNITY): Payer: Self-pay

## 2012-05-17 ENCOUNTER — Other Ambulatory Visit (HOSPITAL_COMMUNITY): Payer: Medicaid Other | Admitting: Psychology

## 2012-05-17 NOTE — Progress Notes (Unsigned)
Patient ID: Shelly Cummings, female   DOB: [birth year], 36 y.o.   MRN: [id number]  Subjective: Shelly Cummings asked to be seen because she is experiencing negative side effects to the Topamax. She complains of lethargy and abdominal pain as well as difficulty with her cognition. She feels that she can do without a mood stabilizer at this time.  Objective: Well-nourished well-developed white female in no acute distress who presents with an appropriate mood and affect. She is fully alert and oriented.  Assessment and Plan: Side effects to Topamax at 50 mg twice daily. Will taper off by decreasing to 25 mg twice daily for 3 days then 25 mg daily for 3 days then stop. If mood instability and sees we will consider resuming the Lamictal, or trying Tegretol.

## 2012-05-20 ENCOUNTER — Other Ambulatory Visit (HOSPITAL_COMMUNITY): Payer: Medicaid Other | Admitting: Psychology

## 2012-05-20 NOTE — Progress Notes (Signed)
    Daily Group Progress Note  Program: CD-IOP   Group Time: 1-2:30 pm  Participation Level: Minimal  Behavioral Response: Minimizing  Type of Therapy: Psycho-education Group  Topic: Wheel of Life: first half of group spent in psycho-educational session. Members were provided with handouts to complete on their Wheel of Life. The wheel consisted of 8 segments reflecting 8 aspects of life that are critical in recovery. Members were asked to complete and draw on the board in order to share with their fellow group members. The session required disclosure and explanation and there was good feedback during the session.  Group Time: 2:45- 4pm  Participation Level: Minimal  Behavioral Response: Rationalizing  Type of Therapy: Process Group  Topic: Process/Wheel of Life: second half of group included the few members to complete their 'wheel". Upon completion, members shared about current issues and struggles they are experiencing. There was good sharing among the group. As the session ended, members shared their plans for the weekend. Every one of them stated their intention to attend at least one 12-step meeting.  Summary: The patient identified money as her only category that is very weak. She described her relationship with family and friends as very good, but I questioned this and asked who her friends were? She could only identify her 56 yo daughter. When asked about her relationship with her mother and brother, the patient admitted they were not good either. The patient is either in denial or putting forth a lot of effort in order to make things appear differently and better than they really are. She stated she remains abstinent with an ongoing sobriety date of May 23rd. She reported an intention to attend a 12-step meeting this weekend, but she has admitted to lying about her attendance and step work with sponsor so it is difficult to believe what she says.    Family Program: Family  present? No   Name of family member(s):   UDS collected: No Results:  AA/NA attended?: No  Sponsor?: No   Sanjith Siwek, LCAS

## 2012-05-21 ENCOUNTER — Other Ambulatory Visit (HOSPITAL_COMMUNITY): Payer: Medicaid Other | Attending: Psychiatry

## 2012-05-21 DIAGNOSIS — F319 Bipolar disorder, unspecified: Secondary | ICD-10-CM | POA: Insufficient documentation

## 2012-05-21 DIAGNOSIS — F1920 Other psychoactive substance dependence, uncomplicated: Secondary | ICD-10-CM | POA: Insufficient documentation

## 2012-05-21 DIAGNOSIS — IMO0001 Reserved for inherently not codable concepts without codable children: Secondary | ICD-10-CM | POA: Insufficient documentation

## 2012-05-21 NOTE — Progress Notes (Signed)
    Daily Group Progress Note  Program: CD-IOP   Group Time: 1-2:30 pm  Participation Level: Active  Behavioral Response: Sharing  Type of Therapy: Psycho-education Group  Topic: The Feeling Chart: first part of group was spent in a presentation on "The Feeling Chart". Upon completing the check-in, and in the company of 3 new group members, a handout was provided identifying the progression of substance use, abuse and, finally, addiction. The chart was focused on the way one learns to change one's feelings through the substance use. While one first begins and finds herself able to change her feelings towards euphoria and pleasure, as the use progresses, there is less pleasure and more pain. Members shared their own experiences as their substance use increased and the growing problems they began to have due to the drugs. There was good disclosure and discussion on the topic for today.  Group Time: 2:45- 4pm Participation Level: none  Behavioral Response: none  Type of Therapy: Process Group  Topic: Group Process and Introductions: second half of group was spent in process. Members shared about their weekend and engagement in [facility]. Two of the three new group members introduced themselves and described their history of drug use and what had brought them to the program. The group responded with support and encouragement to the new members and this first session proved effective for them.  Summary: the patient reported she had attended a meeting on Sunday. This was probably the first one she had gone to in weeks, if not longer. In the discussion on feelings, she admitted that most of her use caused emotional agony and not euphoria as it once had. She was attentive, but shared nothing of herself in process.    Family Program: Family present? No   Name of family member(s):   UDS collected: No Results:   AA/NA attended?: [place]  Sponsor?: No   Jeptha Hinnenkamp,  LCAS

## 2012-05-22 ENCOUNTER — Other Ambulatory Visit (HOSPITAL_COMMUNITY): Payer: Medicaid Other | Admitting: Psychology

## 2012-05-22 ENCOUNTER — Encounter (HOSPITAL_COMMUNITY): Payer: Self-pay | Admitting: Psychology

## 2012-05-23 ENCOUNTER — Other Ambulatory Visit (HOSPITAL_COMMUNITY): Payer: Medicaid Other

## 2012-05-23 NOTE — Progress Notes (Signed)
    Daily Group Progress Note  Program: CD-IOP   Group Time: 1-2:30 pm  Participation Level: Minimal  Behavioral Response: withdrawn  Type of Therapy: Group Therapy  Topic:Feelings: First part of group was spent addressing feelings. A 5 minute breathing exercise was provided, a form of Heart math, and then members were asked to share what they were feeling. While some members were able to identify feelings easily, others found them difficult to identify let alone articulate. I explained that this exercise would be repeated in future sessions in order to teach members to begin to identify their feelings, especially anxiety and tension, and learn how to calm or self-soothe in ways other than drinking or drugging.    Group Time: 2:45- 4pm  Participation Level: Minimal  Behavioral Response: Sharing  Type of Therapy: Process Group  Topic: Group Process: second half of group was spent in process. Members discussed their current issues and struggles in early recovery,. One member had been absent on Monday and she shared about her relapse on alcohol Friday night. Her right eye was swollen and red and she explained that she had fallen on the concrete. The group reviewed the events that had occurred prior to the actual relapse and members all agreed it was a "set-up". The member reported she had felt embarrassed, but now she knows she can't be around people using. Near the conclusion of the session, a member reported she would be departing, but shared her appreciation to the group for their support and encouragement   Summary: the patient reported she liked the breathing exercise and agreed it would be a good thing to practice every day. She admitted that feelings were very difficult and explained that it is uncomfortable to be sober or clean with nothing in her system. She couldn't explain how it was uncomfortable, just that it was. She said little for the remainder of the group, but as the session  neared an end, I informed the group that Shelly Cummings would be leaving at the conclusion of this session. She was willing to share and reported she was grateful for her group members, that she had learned from them and felt like she had benefited from the program. Her pending  departure seemed to fairly irrelevant or unnoticeable as it has been noted before that this patient appears distant or aloof from her fellow group members with little connection having been made between her and others. She will be discharged from the program by the end of the day.   Family Program: Family present? No   Name of family member(s):   UDS collected: No Results:   AA/NA attended?: [place]  Sponsor?: No   Olander Friedl, LCAS

## 2012-05-23 NOTE — Progress Notes (Unsigned)
    Daily Group Progress Note  Program: CD-IOP   Group Time: 1-2:30 pm  Participation Level: {CHL AMB BH Group Participation:21022742}  Behavioral Response: {CHL AMB BH Group Behavior:21022743}  Type of Therapy: {CHL AMB BH Type of Therapy:21022741}  Topic: ***     Group Time: ***  Participation Level: {CHL AMB BH Group Participation:21022742}  Behavioral Response: {CHL AMB BH Group Behavior:21022743}  Type of Therapy: {CHL AMB BH Type of Therapy:21022741}  Topic: ***   Summary: ***   Family Program: Family present? {BHH YES OR NO:22294}   Name of family member(s): ***  UDS collected: {BHH YES OR NO:22294} Results: {Findings; urine drug screen:60936}  AA/NA attended?: {BHH YES OR NO:22294}{DAYS OF WEEK:22385}  Sponsor?: {BHH YES OR NO:22294}   Rosina Cressler, LCAS        

## 2012-05-24 ENCOUNTER — Other Ambulatory Visit (HOSPITAL_COMMUNITY): Payer: Medicaid Other

## 2012-05-24 NOTE — Progress Notes (Signed)
Patient ID: Shelly Cummings, female   DOB: 1976/04/17, 36 y.o.   MRN: [id number] Discharge from CD-IOP. The patient has completed treatment here in the CD-IOP and is discharged today. She had good attendance and all random drug tests were negative, but it was discovered only recently that she had lied about attending AA/NA meetings and her sponsor had not spoken to her for over 3 months. Please see Discharge Summary and Discharge Plan in Epic, dated 05/22/2012.

## 2012-05-27 ENCOUNTER — Other Ambulatory Visit (HOSPITAL_COMMUNITY): Payer: Medicaid Other

## 2012-05-28 ENCOUNTER — Other Ambulatory Visit (HOSPITAL_COMMUNITY): Payer: Medicaid Other

## 2012-05-29 ENCOUNTER — Other Ambulatory Visit (HOSPITAL_COMMUNITY): Payer: Medicaid Other

## 2012-05-30 ENCOUNTER — Other Ambulatory Visit (HOSPITAL_COMMUNITY): Payer: Medicaid Other

## 2012-05-31 ENCOUNTER — Other Ambulatory Visit (HOSPITAL_COMMUNITY): Payer: Medicaid Other

## 2012-06-03 ENCOUNTER — Other Ambulatory Visit (HOSPITAL_COMMUNITY): Payer: Medicaid Other

## 2012-06-04 ENCOUNTER — Other Ambulatory Visit (HOSPITAL_COMMUNITY): Payer: Self-pay

## 2012-06-04 ENCOUNTER — Other Ambulatory Visit (HOSPITAL_COMMUNITY): Payer: Medicaid Other

## 2012-06-04 MED ORDER — TRAZODONE HCL 100 MG PO TABS: ORAL_TABLET | ORAL | Status: DC

## 2012-06-04 MED ORDER — DULOXETINE HCL 60 MG PO CPEP: 120.0000 mg | ORAL_CAPSULE | Freq: Every morning | ORAL | Status: DC

## 2012-06-04 MED ORDER — ARIPIPRAZOLE 5 MG PO TABS: 2.5000 mg | ORAL_TABLET | Freq: Every day | ORAL | Status: DC

## 2012-06-04 NOTE — Telephone Encounter (Signed)
Faxed refill requests on both Cymbalta 120mg  and Abilify 0.25 mg received from [facility] on [address].The request was discussed with both Jorje Guild PA-C and Charmian Muff. Patient had been a participant in  the Cd-IOP program and is familiar to both Hessie Diener and Dewayne Hatch. She has been referred to [facility] per Charmian Muff. Writer instructed to refill both medications.

## 2012-06-05 ENCOUNTER — Other Ambulatory Visit (HOSPITAL_COMMUNITY): Payer: Medicaid Other

## 2012-06-05 ENCOUNTER — Other Ambulatory Visit (HOSPITAL_COMMUNITY): Payer: Self-pay | Admitting: *Deleted

## 2012-06-05 MED ORDER — DULOXETINE HCL 60 MG PO CPEP: 120.0000 mg | ORAL_CAPSULE | Freq: Every morning | ORAL | Status: AC

## 2012-06-05 MED ORDER — ARIPIPRAZOLE 5 MG PO TABS: 2.5000 mg | ORAL_TABLET | Freq: Every day | ORAL | Status: DC

## 2012-06-05 NOTE — Telephone Encounter (Signed)
Received faxed refill request for Cymbalta and Abilify.Per Jorje Guild, PA, pt referred to [facility] after CDIOP.Authorized one refill of each med to cover her until her appt with [facility].

## 2012-06-06 ENCOUNTER — Other Ambulatory Visit (HOSPITAL_COMMUNITY): Payer: Medicaid Other

## 2012-06-07 ENCOUNTER — Other Ambulatory Visit (HOSPITAL_COMMUNITY): Payer: Medicaid Other

## 2012-06-10 ENCOUNTER — Other Ambulatory Visit (HOSPITAL_COMMUNITY): Payer: Medicaid Other

## 2012-06-11 ENCOUNTER — Other Ambulatory Visit (HOSPITAL_COMMUNITY): Payer: Medicaid Other

## 2012-06-12 ENCOUNTER — Other Ambulatory Visit (HOSPITAL_COMMUNITY): Payer: Medicaid Other

## 2012-06-13 ENCOUNTER — Other Ambulatory Visit (HOSPITAL_COMMUNITY): Payer: Medicaid Other

## 2012-06-14 ENCOUNTER — Other Ambulatory Visit (HOSPITAL_COMMUNITY): Payer: Medicaid Other

## 2012-06-17 ENCOUNTER — Other Ambulatory Visit (HOSPITAL_COMMUNITY): Payer: Medicaid Other

## 2012-06-18 ENCOUNTER — Other Ambulatory Visit (HOSPITAL_COMMUNITY): Payer: Medicaid Other

## 2012-06-19 ENCOUNTER — Other Ambulatory Visit (HOSPITAL_COMMUNITY): Payer: Medicaid Other

## 2012-07-01 ENCOUNTER — Ambulatory Visit (HOSPITAL_COMMUNITY): Payer: Self-pay | Admitting: Physician Assistant

## 2012-07-15 ENCOUNTER — Emergency Department (HOSPITAL_BASED_OUTPATIENT_CLINIC_OR_DEPARTMENT_OTHER): Payer: Medicaid Other

## 2012-07-15 ENCOUNTER — Encounter (HOSPITAL_BASED_OUTPATIENT_CLINIC_OR_DEPARTMENT_OTHER): Payer: Self-pay | Admitting: *Deleted

## 2012-07-15 ENCOUNTER — Emergency Department (HOSPITAL_BASED_OUTPATIENT_CLINIC_OR_DEPARTMENT_OTHER)
Admission: EM | Admit: 2012-07-15 | Discharge: 2012-07-15 | Disposition: A | Discharge status: Home / Self Care | Payer: Medicaid Other | Attending: Emergency Medicine | Admitting: Emergency Medicine

## 2012-07-15 DIAGNOSIS — F3289 Other specified depressive episodes: Secondary | ICD-10-CM | POA: Insufficient documentation

## 2012-07-15 DIAGNOSIS — M549 Dorsalgia, unspecified: Secondary | ICD-10-CM | POA: Insufficient documentation

## 2012-07-15 DIAGNOSIS — R1011 Right upper quadrant pain: Secondary | ICD-10-CM | POA: Insufficient documentation

## 2012-07-15 DIAGNOSIS — G40909 Epilepsy, unspecified, not intractable, without status epilepticus: Secondary | ICD-10-CM | POA: Insufficient documentation

## 2012-07-15 DIAGNOSIS — Z79899 Other long term (current) drug therapy: Secondary | ICD-10-CM | POA: Insufficient documentation

## 2012-07-15 DIAGNOSIS — F411 Generalized anxiety disorder: Secondary | ICD-10-CM | POA: Insufficient documentation

## 2012-07-15 DIAGNOSIS — F39 Unspecified mood [affective] disorder: Secondary | ICD-10-CM | POA: Insufficient documentation

## 2012-07-15 DIAGNOSIS — R109 Unspecified abdominal pain: Secondary | ICD-10-CM

## 2012-07-15 DIAGNOSIS — IMO0001 Reserved for inherently not codable concepts without codable children: Secondary | ICD-10-CM | POA: Insufficient documentation

## 2012-07-15 DIAGNOSIS — F329 Major depressive disorder, single episode, unspecified: Secondary | ICD-10-CM | POA: Insufficient documentation

## 2012-07-15 DIAGNOSIS — Z8669 Personal history of other diseases of the nervous system and sense organs: Secondary | ICD-10-CM | POA: Insufficient documentation

## 2012-07-15 LAB — COMPREHENSIVE METABOLIC PANEL
Albumin: 3.7 g/dL (ref 3.5–5.2)
Alkaline Phosphatase: 61 U/L (ref 39–117)
BUN: 6 mg/dL (ref 6–23)
Creatinine, Ser: 0.7 mg/dL (ref 0.50–1.10)
Potassium: 3.8 mEq/L (ref 3.5–5.1)
Total Protein: 7.3 g/dL (ref 6.0–8.3)

## 2012-07-15 LAB — CBC WITH DIFFERENTIAL/PLATELET
Basophils Absolute: 0 10*3/uL (ref 0.0–0.1)
Basophils Relative: 0 % (ref 0–1)
Eosinophils Absolute: 0.1 10*3/uL (ref 0.0–0.7)
Hemoglobin: 13.7 g/dL (ref 12.0–15.0)
MCH: 30.9 pg (ref 26.0–34.0)
MCHC: 35.5 g/dL (ref 30.0–36.0)
Monocytes Relative: 7 % (ref 3–12)
Neutrophils Relative %: 63 % (ref 43–77)
RDW: 12.2 % (ref 11.5–15.5)

## 2012-07-15 LAB — URINALYSIS, ROUTINE W REFLEX MICROSCOPIC
Glucose, UA: NEGATIVE mg/dL
Ketones, ur: NEGATIVE mg/dL
Leukocytes, UA: NEGATIVE
Nitrite: NEGATIVE
Protein, ur: NEGATIVE mg/dL
Urobilinogen, UA: 0.2 mg/dL (ref 0.0–1.0)

## 2012-07-15 LAB — LIPASE, BLOOD: Lipase: 57 U/L (ref 11–59)

## 2012-07-15 MED ORDER — ONDANSETRON HCL 4 MG/2ML IJ SOLN
4.0000 mg | Freq: Once | INTRAMUSCULAR | Status: AC
  Administered 2012-07-15: 4 mg via INTRAVENOUS
  Filled 2012-07-15: qty 2

## 2012-07-15 MED ORDER — SODIUM CHLORIDE 0.9 % IV SOLN
Freq: Once | INTRAVENOUS | Status: AC
  Administered 2012-07-15: 18:00:00 via INTRAVENOUS

## 2012-07-15 MED ORDER — HYDROCODONE-ACETAMINOPHEN 5-325 MG PO TABS: 2.0000 | ORAL_TABLET | ORAL | Status: DC | PRN

## 2012-07-15 MED ORDER — OMEPRAZOLE 20 MG PO CPDR: 20.0000 mg | DELAYED_RELEASE_CAPSULE | Freq: Every day | ORAL | Status: DC

## 2012-07-15 MED ORDER — HYDROMORPHONE HCL PF 1 MG/ML IJ SOLN
1.0000 mg | Freq: Once | INTRAMUSCULAR | Status: AC
  Administered 2012-07-15: 1 mg via INTRAVENOUS
  Filled 2012-07-15: qty 1

## 2012-07-15 MED ORDER — PROMETHAZINE HCL 25 MG PO TABS: 25.0000 mg | ORAL_TABLET | Freq: Four times a day (QID) | ORAL | Status: DC | PRN

## 2012-07-15 NOTE — ED Notes (Signed)
Abdominal pain today with diarrhea.

## 2012-07-15 NOTE — ED Notes (Signed)
Chart reviewed.

## 2012-07-15 NOTE — ED Provider Notes (Signed)
History     CSN: [id number]  Arrival date & time 07/15/12  1627   First MD Initiated Contact with Patient 07/15/12 1634      Chief Complaint  Patient presents with  . Abdominal Pain    (Consider location/radiation/quality/duration/timing/severity/associated sxs/prior treatment) Patient is a 36 y.o. female presenting with abdominal pain. The history is provided by the patient. No language interpreter was used.  Abdominal Pain The primary symptoms of the illness include abdominal pain. The current episode started 13 to 24 hours ago. The onset of the illness was gradual. The problem has been gradually worsening.  The patient states that she believes she is currently not pregnant. The patient has had a change in bowel habit. Risk factors: none. Additional symptoms associated with the illness include back pain. Symptoms associated with the illness do not include chills. Significant associated medical issues do not include PUD, GERD, inflammatory bowel disease or gallstones.  Pt complains of multiple episodes of diarrhea.  Pt complains of upper abdominal pain  Past Medical History  Diagnosis Date  . Migraines   . Anxiety   . Depression   . Fibromyalgia   . Fibromyalgia   . Migraine   . Depression   . Seizures 2002    no reason-none since  . Complication of anesthesia     she bleed alot after c-sec-hard to clot-no other time-no work-up  . Bipolar disorder     Past Surgical History  Procedure Date  . Cesarean section   . Adenoidectomy   . Wisdom tooth extraction   . Eardrum repair   . Orif radial fracture 07/06/2011    Procedure: OPEN REDUCTION INTERNAL FIXATION (ORIF) RADIAL FRACTURE;  Surgeon: Tami Ribas;  Location: [facility];  Service: Orthopedics;  Laterality: Left;  ORIF LEFT DISTAL RADIUS    No family history on file.  History  Substance Use Topics  . Smoking status: Never Smoker   . Smokeless tobacco: Never Used  . Alcohol Use: No    OB  History    Grav Para Term Preterm Abortions TAB SAB Ect Mult Living                  Review of Systems  Constitutional: Negative for chills.  Gastrointestinal: Positive for abdominal pain.  Musculoskeletal: Positive for back pain.  All other systems reviewed and are negative.    Allergies  Sulfa antibiotics  Home Medications   Current Outpatient Rx  Name  Route  Sig  Dispense  Refill  . ADAPALENE 0.1 % EX GEL   Topical   Apply topically at bedtime.          . ARIPIPRAZOLE 5 MG PO TABS   Oral   Take 0.5 tablets (2.5 mg total) by mouth daily.   30 tablet   0   . DULOXETINE HCL 60 MG PO CPEP   Oral   Take 2 capsules (120 mg total) by mouth every morning.   60 capsule   0   . GABAPENTIN 600 MG PO TABS   Oral   Take 600 mg by mouth 3 (three) times daily.          Marland Kitchen LEVONORGESTREL 20 MCG/24HR IU IUD   Intrauterine   1 each by Intrauterine route once.           Marland Kitchen ONE-DAILY MULTI VITAMINS PO TABS   Oral   Take 1 tablet by mouth daily.           Marland Kitchen  TOPIRAMATE 25 MG PO TABS      1 tab twice daily for 7 days, then 2 tabs twice daily for 7 days, then 3 tabs twice daily   84 tablet   0     BP 137/80  Pulse 105  Temp 98.8 F (37.1 C) (Oral)  Resp 22  SpO2 99%  Physical Exam  Nursing note and vitals reviewed. Constitutional: She appears well-developed and well-nourished.  HENT:  Head: Normocephalic.  Right Ear: External ear normal.  Left Ear: External ear normal.  Nose: Nose normal.  Mouth/Throat: Oropharynx is clear and moist.  Eyes: Pupils are equal, round, and reactive to light.  Neck: Normal range of motion. Neck supple.  Cardiovascular: Normal rate and normal heart sounds.   Pulmonary/Chest: Effort normal and breath sounds normal.  Abdominal: Soft. There is tenderness.       Tender right upper quadrant  Musculoskeletal: Normal range of motion.  Neurological: She is alert.  Skin: Skin is warm.  Psychiatric: She has a normal mood and  affect.    ED Course  Procedures (including critical care time)  Labs Reviewed  URINALYSIS, ROUTINE W REFLEX MICROSCOPIC - Abnormal; Notable for the following:    APPearance CLOUDY (*)     All other components within normal limits  PREGNANCY, URINE   No results found.   No diagnosis found.    MDM   Results for orders placed during the hospital encounter of 07/15/12  URINALYSIS, ROUTINE W REFLEX MICROSCOPIC      Component Value Range   Color, Urine YELLOW  YELLOW   APPearance CLOUDY (*) CLEAR   Specific Gravity, Urine 1.010  1.005 - 1.030   pH 6.5  5.0 - 8.0   Glucose, UA NEGATIVE  NEGATIVE mg/dL   Hgb urine dipstick NEGATIVE  NEGATIVE   Bilirubin Urine NEGATIVE  NEGATIVE   Ketones, ur NEGATIVE  NEGATIVE mg/dL   Protein, ur NEGATIVE  NEGATIVE mg/dL   Urobilinogen, UA 0.2  0.0 - 1.0 mg/dL   Nitrite NEGATIVE  NEGATIVE   Leukocytes, UA NEGATIVE  NEGATIVE  PREGNANCY, URINE      Component Value Range   Preg Test, Ur NEGATIVE  NEGATIVE  CBC WITH DIFFERENTIAL      Component Value Range   WBC 8.1  4.0 - 10.5 K/uL   RBC 4.44  3.87 - 5.11 MIL/uL   Hemoglobin 13.7  12.0 - 15.0 g/dL   HCT 16.1  09.6 - 04.5 %   MCV 86.9  78.0 - 100.0 fL   MCH 30.9  26.0 - 34.0 pg   MCHC 35.5  30.0 - 36.0 g/dL   RDW 40.9  81.1 - 91.4 %   Platelets 258  150 - 400 K/uL   Neutrophils Relative 63  43 - 77 %   Neutro Abs 5.1  1.7 - 7.7 K/uL   Lymphocytes Relative 29  12 - 46 %   Lymphs Abs 2.4  0.7 - 4.0 K/uL   Monocytes Relative 7  3 - 12 %   Monocytes Absolute 0.5  0.1 - 1.0 K/uL   Eosinophils Relative 2  0 - 5 %   Eosinophils Absolute 0.1  0.0 - 0.7 K/uL   Basophils Relative 0  0 - 1 %   Basophils Absolute 0.0  0.0 - 0.1 K/uL  COMPREHENSIVE METABOLIC PANEL      Component Value Range   Sodium 137  135 - 145 mEq/L   Potassium 3.8  3.5 - 5.1 mEq/L  Chloride 102  96 - 112 mEq/L   CO2 22  19 - 32 mEq/L   Glucose, Bld 124 (*) 70 - 99 mg/dL   BUN 6  6 - 23 mg/dL   Creatinine, Ser 1.61   0.50 - 1.10 mg/dL   Calcium 9.5  8.4 - 09.6 mg/dL   Total Protein 7.3  6.0 - 8.3 g/dL   Albumin 3.7  3.5 - 5.2 g/dL   AST 15  0 - 37 U/L   ALT 12  0 - 35 U/L   Alkaline Phosphatase 61  39 - 117 U/L   Total Bilirubin 0.4  0.3 - 1.2 mg/dL   GFR calc non Af Amer >90  >90 mL/min   GFR calc Af Amer >90  >90 mL/min  LIPASE, BLOOD      Component Value Range   Lipase 57  11 - 59 U/L   US Abdomen Complete  07/15/2012  *RADIOLOGY REPORT*  Clinical Data:  Right upper quadrant abdominal pain.  COMPLETE ABDOMINAL ULTRASOUND  Comparison:  No priors.  Findings:  Gallbladder:  No shadowing gallstones or echogenic sludge.  No gallbladder wall thickening or pericholecystic fluid.  Negative sonographic Murphy's sign according to the ultrasound technologist.  Common bile duct:  Normal caliber measuring 2.1 mm in the porta hepatis.  Liver:  Normal size and echotexture without focal parenchymal abnormality.  Patent portal vein with hepatopetal flow.  IVC:  Poorly visualized secondary to overlying bowel gas.  Pancreas:  Poorly visualized secondary to overlying bowel gas.  Spleen:  Normal size and echotexture without focal parenchymal abnormality. 7.7 cm in length.  Right Kidney:  No hydronephrosis.  Well-preserved cortex.  Normal size and parenchymal echotexture without focal abnormalities. 10.3 cm in length.  Left Kidney:  No hydronephrosis.  Well-preserved cortex.  Normal size and parenchymal echotexture without focal abnormalities. 9.6 cm in length.  Abdominal aorta:  Poorly visualized but measures up to 2.6 cm in diameter proximally.  IMPRESSION: 1.  No acute findings to account for the patient's symptoms. Specifically, no evidence of cholelithiasis or findings to suggest acute cholecystitis.   Original Report Authenticated By: Trudie Reed, M.D.       I will start pt on prevacid.  Pt is advised to follow up with her primary for recheck in 2-3 days    Lonia Skinner [place], [place] 07/15/12 1919

## 2012-07-15 NOTE — ED Notes (Signed)
Patient back from ultrasound

## 2012-07-16 NOTE — ED Provider Notes (Signed)
Medical screening examination/treatment/procedure(s) were performed by non-physician practitioner and as supervising physician I was immediately available for consultation/collaboration.   Glynn Octave, MD 07/16/12 [id number]

## 2012-09-04 ENCOUNTER — Emergency Department (HOSPITAL_BASED_OUTPATIENT_CLINIC_OR_DEPARTMENT_OTHER)
Admission: EM | Admit: 2012-09-04 | Discharge: 2012-09-04 | Disposition: A | Discharge status: Home / Self Care | Payer: Medicaid Other | Attending: Emergency Medicine | Admitting: Emergency Medicine

## 2012-09-04 ENCOUNTER — Encounter (HOSPITAL_BASED_OUTPATIENT_CLINIC_OR_DEPARTMENT_OTHER): Payer: Self-pay | Admitting: *Deleted

## 2012-09-04 DIAGNOSIS — F411 Generalized anxiety disorder: Secondary | ICD-10-CM | POA: Insufficient documentation

## 2012-09-04 DIAGNOSIS — R109 Unspecified abdominal pain: Secondary | ICD-10-CM | POA: Insufficient documentation

## 2012-09-04 DIAGNOSIS — Z79899 Other long term (current) drug therapy: Secondary | ICD-10-CM | POA: Insufficient documentation

## 2012-09-04 DIAGNOSIS — F3289 Other specified depressive episodes: Secondary | ICD-10-CM | POA: Insufficient documentation

## 2012-09-04 DIAGNOSIS — R509 Fever, unspecified: Secondary | ICD-10-CM | POA: Insufficient documentation

## 2012-09-04 DIAGNOSIS — G40909 Epilepsy, unspecified, not intractable, without status epilepticus: Secondary | ICD-10-CM | POA: Insufficient documentation

## 2012-09-04 DIAGNOSIS — IMO0001 Reserved for inherently not codable concepts without codable children: Secondary | ICD-10-CM | POA: Insufficient documentation

## 2012-09-04 DIAGNOSIS — K5289 Other specified noninfective gastroenteritis and colitis: Secondary | ICD-10-CM | POA: Insufficient documentation

## 2012-09-04 DIAGNOSIS — Z3202 Encounter for pregnancy test, result negative: Secondary | ICD-10-CM | POA: Insufficient documentation

## 2012-09-04 DIAGNOSIS — F329 Major depressive disorder, single episode, unspecified: Secondary | ICD-10-CM | POA: Insufficient documentation

## 2012-09-04 DIAGNOSIS — Z8679 Personal history of other diseases of the circulatory system: Secondary | ICD-10-CM | POA: Insufficient documentation

## 2012-09-04 DIAGNOSIS — K529 Noninfective gastroenteritis and colitis, unspecified: Secondary | ICD-10-CM

## 2012-09-04 DIAGNOSIS — R197 Diarrhea, unspecified: Secondary | ICD-10-CM | POA: Insufficient documentation

## 2012-09-04 LAB — CBC WITH DIFFERENTIAL/PLATELET
Basophils Absolute: 0 10*3/uL (ref 0.0–0.1)
Eosinophils Relative: 1 % (ref 0–5)
Lymphocytes Relative: 31 % (ref 12–46)
Lymphs Abs: 2.8 10*3/uL (ref 0.7–4.0)
Neutro Abs: 5.5 10*3/uL (ref 1.7–7.7)
Neutrophils Relative %: 61 % (ref 43–77)
Platelets: 231 10*3/uL (ref 150–400)
RBC: 4.21 MIL/uL (ref 3.87–5.11)
RDW: 12.6 % (ref 11.5–15.5)
WBC: 9 10*3/uL (ref 4.0–10.5)

## 2012-09-04 LAB — URINALYSIS, ROUTINE W REFLEX MICROSCOPIC
Leukocytes, UA: NEGATIVE
Nitrite: NEGATIVE
Specific Gravity, Urine: 1.024 (ref 1.005–1.030)
pH: 5.5 (ref 5.0–8.0)

## 2012-09-04 LAB — COMPREHENSIVE METABOLIC PANEL
ALT: 12 U/L (ref 0–35)
AST: 14 U/L (ref 0–37)
Alkaline Phosphatase: 61 U/L (ref 39–117)
CO2: 21 mEq/L (ref 19–32)
Calcium: 8.9 mg/dL (ref 8.4–10.5)
GFR calc Af Amer: >90 mL/min (ref >90)
GFR calc non Af Amer: >90 mL/min (ref >90)
Glucose, Bld: 126 mg/dL — ABNORMAL HIGH (ref 70–99)
Potassium: 3.4 mEq/L — ABNORMAL LOW (ref 3.5–5.1)
Sodium: 137 mEq/L (ref 135–145)
Total Protein: 7 g/dL (ref 6.0–8.3)

## 2012-09-04 MED ORDER — FENTANYL CITRATE 0.05 MG/ML IJ SOLN
100.0000 ug | Freq: Once | INTRAMUSCULAR | Status: AC
  Administered 2012-09-04: 100 ug via INTRAVENOUS
  Filled 2012-09-04: qty 2

## 2012-09-04 MED ORDER — ONDANSETRON HCL 4 MG/2ML IJ SOLN
4.0000 mg | Freq: Once | INTRAMUSCULAR | Status: AC
  Administered 2012-09-04: 4 mg via INTRAVENOUS
  Filled 2012-09-04: qty 2

## 2012-09-04 MED ORDER — SODIUM CHLORIDE 0.9 % IV BOLUS (SEPSIS)
1000.0000 mL | Freq: Once | INTRAVENOUS | Status: AC
  Administered 2012-09-04: 1000 mL via INTRAVENOUS

## 2012-09-04 MED ORDER — PROMETHAZINE HCL 25 MG PO TABS: 25.0000 mg | ORAL_TABLET | Freq: Four times a day (QID) | ORAL | Status: DC | PRN

## 2012-09-04 NOTE — ED Notes (Signed)
MD at bedside. 

## 2012-09-04 NOTE — ED Notes (Signed)
Pt c/o n/v/d x 2 days with fever

## 2012-09-04 NOTE — ED Provider Notes (Signed)
History     CSN: [id number]  Arrival date & time 09/04/12  7846   First MD Initiated Contact with Patient 09/04/12 1832      Chief Complaint  Patient presents with  . Emesis  . Diarrhea     HPI Patient presents with two-day history of nausea vomiting and diarrhea associated with some fever.  Bodyaches myalgias accompanied the symptoms.  Patient's also having some low abdominal pain.  No hematemesis hematochezia.  No melena.After treatment in the ED the patient feels back to baseline and wants to go home.  Past Medical History  Diagnosis Date  . Migraines   . Anxiety   . Depression   . Fibromyalgia   . Fibromyalgia   . Migraine   . Depression   . Seizures 2002    no reason-none since  . Complication of anesthesia     she bleed alot after c-sec-hard to clot-no other time-no work-up  . Bipolar disorder     Past Surgical History  Procedure Date  . Cesarean section   . Adenoidectomy   . Wisdom tooth extraction   . Eardrum repair   . Orif radial fracture 07/06/2011    Procedure: OPEN REDUCTION INTERNAL FIXATION (ORIF) RADIAL FRACTURE;  Surgeon: Tami Ribas;  Location: [facility];  Service: Orthopedics;  Laterality: Left;  ORIF LEFT DISTAL RADIUS    History reviewed. No pertinent family history.  History  Substance Use Topics  . Smoking status: Never Smoker   . Smokeless tobacco: Never Used  . Alcohol Use: No    OB History    Grav Para Term Preterm Abortions TAB SAB Ect Mult Living                  Review of Systems All other systems reviewed and are negative Allergies  Sulfa antibiotics  Home Medications   Current Outpatient Rx  Name  Route  Sig  Dispense  Refill  . ADAPALENE 0.1 % EX GEL   Topical   Apply topically at bedtime.          . ARIPIPRAZOLE 5 MG PO TABS   Oral   Take 0.5 tablets (2.5 mg total) by mouth daily.   30 tablet   0   . DULOXETINE HCL 60 MG PO CPEP   Oral   Take 2 capsules (120 mg total) by mouth every  morning.   60 capsule   0   . GABAPENTIN 600 MG PO TABS   Oral   Take 600 mg by mouth 3 (three) times daily.          Marland Kitchen HYDROCODONE-ACETAMINOPHEN 5-325 MG PO TABS   Oral   Take 2 tablets by mouth every 4 (four) hours as needed for pain.   10 tablet   0   . LEVONORGESTREL 20 MCG/24HR IU IUD   Intrauterine   1 each by Intrauterine route once.           Marland Kitchen ONE-DAILY MULTI VITAMINS PO TABS   Oral   Take 1 tablet by mouth daily.           Marland Kitchen OMEPRAZOLE 20 MG PO CPDR   Oral   Take 1 capsule (20 mg total) by mouth daily.   20 capsule   0   . PROMETHAZINE HCL 25 MG PO TABS   Oral   Take 1 tablet (25 mg total) by mouth every 6 (six) hours as needed for nausea.   12 tablet   0   .  PROMETHAZINE HCL 25 MG PO TABS   Oral   Take 1 tablet (25 mg total) by mouth every 6 (six) hours as needed for nausea.   20 tablet   0   . TOPIRAMATE 25 MG PO TABS      1 tab twice daily for 7 days, then 2 tabs twice daily for 7 days, then 3 tabs twice daily   84 tablet   0     BP 129/83  Pulse 98  Temp 98.2 F (36.8 C) (Oral)  Resp 16  Ht 5\' 1"  (1.549 m)  Wt 182 lb (82.555 kg)  BMI 34.39 kg/m2  SpO2 100%  Physical Exam  Nursing note and vitals reviewed. Constitutional: She is oriented to person, place, and time. She appears well-developed and well-nourished. No distress.  HENT:  Head: Normocephalic and atraumatic.  Eyes: Pupils are equal, round, and reactive to light.  Neck: Normal range of motion.  Cardiovascular: Normal rate and intact distal pulses.   Pulmonary/Chest: Effort normal and breath sounds normal. No respiratory distress.  Abdominal: Soft. Normal appearance. She exhibits no distension. There is no tenderness. There is no rebound.  Musculoskeletal: Normal range of motion.  Neurological: She is alert and oriented to person, place, and time. No cranial nerve deficit.  Skin: Skin is warm and dry. No rash noted.  Psychiatric: She has a normal mood and affect. Her  behavior is normal.    ED Course  Procedures (including critical care time)  Medications  promethazine (PHENERGAN) 25 MG tablet (not administered)  sodium chloride 0.9 % bolus 1,000 mL (0 mL Intravenous Stopped 09/04/12 1958)  fentaNYL (SUBLIMAZE) injection 100 mcg (100 mcg Intravenous Given 09/04/12 1900)  ondansetron (ZOFRAN) injection 4 mg (4 mg Intravenous Given 09/04/12 1900)  fentaNYL (SUBLIMAZE) injection 100 mcg (100 mcg Intravenous Given 09/04/12 1936)    Labs Reviewed  URINALYSIS, ROUTINE W REFLEX MICROSCOPIC - Abnormal; Notable for the following:    APPearance CLOUDY (*)     All other components within normal limits  COMPREHENSIVE METABOLIC PANEL - Abnormal; Notable for the following:    Potassium 3.4 (*)     Glucose, Bld 126 (*)     All other components within normal limits  PREGNANCY, URINE  CBC WITH DIFFERENTIAL   No results found.   1. Gastroenteritis       MDM          Shelly Shi, MD 09/04/12 2027

## 2012-09-04 NOTE — ED Notes (Signed)
Pt requested and was given ginger ale for po challenge.

## 2012-09-04 NOTE — ED Notes (Signed)
Pt tolerating po fluids and pain is decreased. Pt sts she feels ready to go home.

## 2012-12-27 ENCOUNTER — Encounter (HOSPITAL_BASED_OUTPATIENT_CLINIC_OR_DEPARTMENT_OTHER): Payer: Self-pay | Admitting: *Deleted

## 2012-12-27 ENCOUNTER — Emergency Department (HOSPITAL_BASED_OUTPATIENT_CLINIC_OR_DEPARTMENT_OTHER)
Admission: EM | Admit: 2012-12-27 | Discharge: 2012-12-27 | Disposition: A | Discharge status: Home / Self Care | Payer: Medicaid Other | Attending: Emergency Medicine | Admitting: Emergency Medicine

## 2012-12-27 DIAGNOSIS — F319 Bipolar disorder, unspecified: Secondary | ICD-10-CM | POA: Insufficient documentation

## 2012-12-27 DIAGNOSIS — G43909 Migraine, unspecified, not intractable, without status migrainosus: Secondary | ICD-10-CM | POA: Insufficient documentation

## 2012-12-27 DIAGNOSIS — F411 Generalized anxiety disorder: Secondary | ICD-10-CM | POA: Insufficient documentation

## 2012-12-27 DIAGNOSIS — Z8739 Personal history of other diseases of the musculoskeletal system and connective tissue: Secondary | ICD-10-CM | POA: Insufficient documentation

## 2012-12-27 DIAGNOSIS — G40909 Epilepsy, unspecified, not intractable, without status epilepticus: Secondary | ICD-10-CM | POA: Insufficient documentation

## 2012-12-27 DIAGNOSIS — R112 Nausea with vomiting, unspecified: Secondary | ICD-10-CM | POA: Insufficient documentation

## 2012-12-27 DIAGNOSIS — Z8781 Personal history of (healed) traumatic fracture: Secondary | ICD-10-CM | POA: Insufficient documentation

## 2012-12-27 DIAGNOSIS — Z79899 Other long term (current) drug therapy: Secondary | ICD-10-CM | POA: Insufficient documentation

## 2012-12-27 MED ORDER — METOCLOPRAMIDE HCL 5 MG/ML IJ SOLN
10.0000 mg | Freq: Once | INTRAMUSCULAR | Status: AC
  Administered 2012-12-27: 10 mg via INTRAMUSCULAR
  Filled 2012-12-27: qty 2

## 2012-12-27 MED ORDER — DIPHENHYDRAMINE HCL 50 MG/ML IJ SOLN
25.0000 mg | Freq: Once | INTRAMUSCULAR | Status: AC
  Administered 2012-12-27: 25 mg via INTRAMUSCULAR
  Filled 2012-12-27: qty 1

## 2012-12-27 MED ORDER — KETOROLAC TROMETHAMINE 60 MG/2ML IM SOLN
60.0000 mg | Freq: Once | INTRAMUSCULAR | Status: AC
  Administered 2012-12-27: 60 mg via INTRAMUSCULAR
  Filled 2012-12-27: qty 2

## 2012-12-27 MED ORDER — HYDROCODONE-ACETAMINOPHEN 5-325 MG PO TABS
2.0000 | ORAL_TABLET | Freq: Once | ORAL | Status: AC
  Administered 2012-12-27: 2 via ORAL
  Filled 2012-12-27: qty 2

## 2012-12-27 NOTE — ED Notes (Signed)
MD at bedside. 

## 2012-12-27 NOTE — ED Provider Notes (Signed)
History     CSN: [id number]  Arrival date & time 12/27/12  2125   First MD Initiated Contact with Patient 12/27/12 2145      Chief Complaint  Patient presents with  . Migraine    (Consider location/radiation/quality/duration/timing/severity/associated sxs/prior treatment) HPI Comments: Patient presents to the ER for evaluation of migraine headache. Patient reports that she has a history of recurrent migraines. Pain is on the left side of her head, behind her eyes. This is typical for her migraines. It is worsened by bright lights. She has had nausea and vomiting. There is no neck pain or stiffness. There has not been any fever. Patient denies any features unusual compared to previous migraines. This is a typical migraine. It started yesterday, progressively worsened.  Patient is a 37 y.o. female presenting with migraines.  Migraine Associated symptoms include headaches.    Past Medical History  Diagnosis Date  . Migraines   . Anxiety   . Depression   . Fibromyalgia   . Fibromyalgia   . Migraine   . Depression   . Seizures 2002    no reason-none since  . Complication of anesthesia     she bleed alot after c-sec-hard to clot-no other time-no work-up  . Bipolar disorder     Past Surgical History  Procedure Laterality Date  . Cesarean section    . Adenoidectomy    . Wisdom tooth extraction    . Eardrum repair    . Orif radial fracture  07/06/2011    Procedure: OPEN REDUCTION INTERNAL FIXATION (ORIF) RADIAL FRACTURE;  Surgeon: Tami Ribas;  Location: [facility];  Service: Orthopedics;  Laterality: Left;  ORIF LEFT DISTAL RADIUS    History reviewed. No pertinent family history.  History  Substance Use Topics  . Smoking status: Never Smoker   . Smokeless tobacco: Never Used  . Alcohol Use: No    OB History   Grav Para Term Preterm Abortions TAB SAB Ect Mult Living                  Review of Systems  Constitutional: Negative for fever.  Eyes:  Positive for photophobia.  Gastrointestinal: Positive for nausea and vomiting.  Neurological: Positive for headaches.  All other systems reviewed and are negative.    Allergies  Sulfa antibiotics  Home Medications   Current Outpatient Rx  Name  Route  Sig  Dispense  Refill  . adapalene (DIFFERIN) 0.1 % gel   Topical   Apply topically at bedtime.          Marland Kitchen EXPIRED: ARIPiprazole (ABILIFY) 5 MG tablet   Oral   Take 0.5 tablets (2.5 mg total) by mouth daily.   30 tablet   0   . DULoxetine (CYMBALTA) 60 MG capsule   Oral   Take 2 capsules (120 mg total) by mouth every morning.   60 capsule   0   . gabapentin (NEURONTIN) 600 MG tablet   Oral   Take 600 mg by mouth 3 (three) times daily.          Marland Kitchen HYDROcodone-acetaminophen (NORCO/VICODIN) 5-325 MG per tablet   Oral   Take 2 tablets by mouth every 4 (four) hours as needed for pain.   10 tablet   0   . levonorgestrel (MIRENA) 20 MCG/24HR IUD   Intrauterine   1 each by Intrauterine route once.           . Multiple Vitamin (MULTIVITAMIN) tablet   Oral  Take 1 tablet by mouth daily.           Marland Kitchen omeprazole (PRILOSEC) 20 MG capsule   Oral   Take 1 capsule (20 mg total) by mouth daily.   20 capsule   0   . promethazine (PHENERGAN) 25 MG tablet   Oral   Take 1 tablet (25 mg total) by mouth every 6 (six) hours as needed for nausea.   12 tablet   0   . promethazine (PHENERGAN) 25 MG tablet   Oral   Take 1 tablet (25 mg total) by mouth every 6 (six) hours as needed for nausea.   20 tablet   0   . topiramate (TOPAMAX) 25 MG tablet      1 tab twice daily for 7 days, then 2 tabs twice daily for 7 days, then 3 tabs twice daily   84 tablet   0     BP 120/82  Pulse 86  Temp(Src) 98.7 F (37.1 C) (Oral)  Resp 16  Ht 5\' 1"  (1.549 m)  Wt 180 lb (81.647 kg)  BMI 34.03 kg/m2  SpO2 99%  Physical Exam  Constitutional: She is oriented to person, place, and time. She appears well-developed and  well-nourished. No distress.  HENT:  Head: Normocephalic and atraumatic.  Right Ear: Hearing normal.  Left Ear: Hearing normal.  Nose: Nose normal.  Mouth/Throat: Oropharynx is clear and moist and mucous membranes are normal.  Eyes: Conjunctivae and EOM are normal. Pupils are equal, round, and reactive to light.  Neck: Normal range of motion. Neck supple.  Cardiovascular: Regular rhythm, S1 normal and S2 normal.  Exam reveals no gallop and no friction rub.   No murmur heard. Pulmonary/Chest: Effort normal and breath sounds normal. No respiratory distress. She exhibits no tenderness.  Abdominal: Soft. Normal appearance and bowel sounds are normal. There is no hepatosplenomegaly. There is no tenderness. There is no rebound, no guarding, no tenderness at McBurney's point and negative Murphy's sign. No hernia.  Musculoskeletal: Normal range of motion.  Neurological: She is alert and oriented to person, place, and time. She has normal strength. No cranial nerve deficit or sensory deficit. Coordination normal. GCS eye subscore is 4. GCS verbal subscore is 5. GCS motor subscore is 6.  Skin: Skin is warm, dry and intact. No rash noted. No cyanosis.  Psychiatric: She has a normal mood and affect. Her speech is normal and behavior is normal. Thought content normal.    ED Course  Procedures (including critical care time)  Labs Reviewed - No data to display No results found.   Diagnosis: Migraine headache    MDM  Patient comes to the ER for evaluation of headache. Patient has a history of chronic migraines, headache today is similar to previous migraines. There is no workup necessary based on the fact she has a normal neurologic examination.        Gilda Crease, MD 12/27/12 [id number]

## 2012-12-27 NOTE — ED Notes (Signed)
Pt reports migraine x 2 days. 

## 2012-12-27 NOTE — Discharge Instructions (Signed)
Migraine Headache °A migraine headache is very bad, throbbing pain on one or both sides of your head. Talk to your doctor about what things may bring on (trigger) your migraine headaches. °HOME CARE °· Only take medicines as told by your doctor. °· Lie down in a dark, quiet room when you have a migraine. °· Keep a journal to find out if certain things bring on migraine headaches. For example, write down: °· What you eat and drink. °· How much sleep you get. °· Any change to your diet or medicines. °· Lessen how much alcohol you drink. °· Quit smoking if you smoke. °· Get enough sleep. °· Lessen any stress in your life. °· Keep lights dim if bright lights bother you or make your migraines worse. °GET HELP RIGHT AWAY IF:  °· Your migraine becomes really bad. °· You have a fever. °· You have a stiff neck. °· You have trouble seeing. °· Your muscles are weak, or you lose muscle control. °· You lose your balance or have trouble walking. °· You feel like you will pass out (faint), or you pass out. °· You have really bad symptoms that are different than your first symptoms. °MAKE SURE YOU:  °· Understand these instructions. °· Will watch your condition. °· Will get help right away if you are not doing well or get worse. °Document Released: 05/16/2008 Document Revised: 10/30/2011 Document Reviewed: 07/28/2011 °[employer]® Patient Information ©2013 [employer]. ° °

## 2013-03-12 ENCOUNTER — Encounter (HOSPITAL_BASED_OUTPATIENT_CLINIC_OR_DEPARTMENT_OTHER): Payer: Self-pay | Admitting: *Deleted

## 2013-03-12 ENCOUNTER — Emergency Department (HOSPITAL_BASED_OUTPATIENT_CLINIC_OR_DEPARTMENT_OTHER)
Admission: EM | Admit: 2013-03-12 | Discharge: 2013-03-12 | Disposition: A | Discharge status: Home / Self Care | Payer: Medicaid Other | Attending: Emergency Medicine | Admitting: Emergency Medicine

## 2013-03-12 ENCOUNTER — Emergency Department (HOSPITAL_BASED_OUTPATIENT_CLINIC_OR_DEPARTMENT_OTHER): Payer: Medicaid Other

## 2013-03-12 DIAGNOSIS — Z8679 Personal history of other diseases of the circulatory system: Secondary | ICD-10-CM | POA: Insufficient documentation

## 2013-03-12 DIAGNOSIS — Y9389 Activity, other specified: Secondary | ICD-10-CM | POA: Insufficient documentation

## 2013-03-12 DIAGNOSIS — M25532 Pain in left wrist: Secondary | ICD-10-CM

## 2013-03-12 DIAGNOSIS — F319 Bipolar disorder, unspecified: Secondary | ICD-10-CM | POA: Insufficient documentation

## 2013-03-12 DIAGNOSIS — R296 Repeated falls: Secondary | ICD-10-CM | POA: Insufficient documentation

## 2013-03-12 DIAGNOSIS — S59909A Unspecified injury of unspecified elbow, initial encounter: Secondary | ICD-10-CM | POA: Insufficient documentation

## 2013-03-12 DIAGNOSIS — Y929 Unspecified place or not applicable: Secondary | ICD-10-CM | POA: Insufficient documentation

## 2013-03-12 DIAGNOSIS — S6990XA Unspecified injury of unspecified wrist, hand and finger(s), initial encounter: Secondary | ICD-10-CM | POA: Insufficient documentation

## 2013-03-12 DIAGNOSIS — Z8669 Personal history of other diseases of the nervous system and sense organs: Secondary | ICD-10-CM | POA: Insufficient documentation

## 2013-03-12 DIAGNOSIS — F411 Generalized anxiety disorder: Secondary | ICD-10-CM | POA: Insufficient documentation

## 2013-03-12 MED ORDER — HYDROCODONE-ACETAMINOPHEN 5-325 MG PO TABS
2.0000 | ORAL_TABLET | Freq: Once | ORAL | Status: AC
  Administered 2013-03-12: 2 via ORAL
  Filled 2013-03-12: qty 2

## 2013-03-12 MED ORDER — HYDROCODONE-ACETAMINOPHEN 5-325 MG PO TABS: 1.0000 | ORAL_TABLET | Freq: Three times a day (TID) | ORAL | Status: DC | PRN

## 2013-03-12 NOTE — ED Notes (Signed)
Pt c/o left wrist injury  X 1 hr ago

## 2013-03-12 NOTE — ED Provider Notes (Signed)
History    CSN: [id number] Arrival date & time 03/12/13  1641  First MD Initiated Contact with Patient 03/12/13 1721     Chief Complaint  Patient presents with  . Wrist Pain   (Consider location/radiation/quality/duration/timing/severity/associated sxs/prior Treatment) The history is provided by the patient. No language interpreter was used.  Shelly Cummings is a 37 y/o F with PMHx of CABG in 08/2012, migraines, anxiety, depression presenting to the ED with left wrist pain that started today. Patient reported that when she was sitting down she went to lean down to pick up her cat and as she leaned she lost her balanced and landed on her left hand in an outstretched manner. Patient reported that she felt pain immediately after the event, described as a burning sensation to the left hand and up the left arm. Patient reported that nothing makes the pain better or worse - stated that she used Tylenol with minimal relief. Denied numbness, tingling, loss of sensation. PCP none  Past Medical History  Diagnosis Date  . Migraines   . Anxiety   . Depression   . Fibromyalgia   . Fibromyalgia   . Migraine   . Depression   . Seizures 2002    no reason-none since  . Complication of anesthesia     she bleed alot after c-sec-hard to clot-no other time-no work-up  . Bipolar disorder    Past Surgical History  Procedure Laterality Date  . Cesarean section    . Adenoidectomy    . Wisdom tooth extraction    . Eardrum repair    . Orif radial fracture  07/06/2011    Procedure: OPEN REDUCTION INTERNAL FIXATION (ORIF) RADIAL FRACTURE;  Surgeon: Tami Ribas;  Location: [facility];  Service: Orthopedics;  Laterality: Left;  ORIF LEFT DISTAL RADIUS   History reviewed. No pertinent family history. History  Substance Use Topics  . Smoking status: Never Smoker   . Smokeless tobacco: Never Used  . Alcohol Use: No   OB History   Grav Para Term Preterm Abortions TAB SAB Ect Mult Living                  Review of Systems  Constitutional: Negative for fever and chills.  HENT: Negative for neck pain.   Respiratory: Negative for chest tightness and shortness of breath.   Cardiovascular: Negative for chest pain.  Musculoskeletal: Positive for arthralgias (left wrist pain).  Neurological: Negative for weakness and numbness.  All other systems reviewed and are negative.    Allergies  Sulfa antibiotics  Home Medications   Current Outpatient Rx  Name  Route  Sig  Dispense  Refill  . adapalene (DIFFERIN) 0.1 % gel   Topical   Apply topically at bedtime.          Marland Kitchen EXPIRED: ARIPiprazole (ABILIFY) 5 MG tablet   Oral   Take 0.5 tablets (2.5 mg total) by mouth daily.   30 tablet   0   . DULoxetine (CYMBALTA) 60 MG capsule   Oral   Take 2 capsules (120 mg total) by mouth every morning.   60 capsule   0   . HYDROcodone-acetaminophen (NORCO) 5-325 MG per tablet   Oral   Take 1 tablet by mouth every 8 (eight) hours as needed for pain.   11 tablet   0   . HYDROcodone-acetaminophen (NORCO/VICODIN) 5-325 MG per tablet   Oral   Take 2 tablets by mouth every 4 (four) hours as  needed for pain.   10 tablet   0   . levonorgestrel (MIRENA) 20 MCG/24HR IUD   Intrauterine   1 each by Intrauterine route once.           . Multiple Vitamin (MULTIVITAMIN) tablet   Oral   Take 1 tablet by mouth daily.           Marland Kitchen omeprazole (PRILOSEC) 20 MG capsule   Oral   Take 1 capsule (20 mg total) by mouth daily.   20 capsule   0   . promethazine (PHENERGAN) 25 MG tablet   Oral   Take 1 tablet (25 mg total) by mouth every 6 (six) hours as needed for nausea.   12 tablet   0   . promethazine (PHENERGAN) 25 MG tablet   Oral   Take 1 tablet (25 mg total) by mouth every 6 (six) hours as needed for nausea.   20 tablet   0    BP 119/75  Pulse 96  Temp(Src) 99 F (37.2 C) (Oral)  Resp 16  Ht 5\' 2"  (1.575 m)  Wt 180 lb (81.647 kg)  BMI 32.91 kg/m2  SpO2  100% Physical Exam  Nursing note and vitals reviewed. Constitutional: She is oriented to person, place, and time. She appears well-developed and well-nourished. No distress.  HENT:  Head: Normocephalic and atraumatic.  Eyes: Conjunctivae and EOM are normal. Pupils are equal, round, and reactive to light. Right eye exhibits no discharge. Left eye exhibits no discharge.  Neck: Normal range of motion. Neck supple.  Cardiovascular: Normal rate, regular rhythm and normal heart sounds.  Exam reveals no friction rub.   No murmur heard. Pulses:      Radial pulses are 2+ on the right side, and 2+ on the left side.  Pulmonary/Chest: Effort normal and breath sounds normal. No respiratory distress. She has no wheezes. She has no rales.  Musculoskeletal: Normal range of motion. She exhibits tenderness.  Full ROM to the left wrist - full flexion, extension, inversion, and eversion Negative swelling, erythema, inflammation, deformities, warmth noted to the left wrist  Discomfort noted to palpation of the thenar region, snuffbox, thumb, index finger of the left hand Full flexion and extension of the left digits  Neurological: She is alert and oriented to person, place, and time. She exhibits normal muscle tone. Coordination normal.  Strength 5+/5+ with resistance to teh left wrist and hand Sensation intact to left wrist and hand with differentiation to sharp and dull touch  Skin: Skin is warm and dry. No rash noted. She is not diaphoretic. No erythema.  Psychiatric: She has a normal mood and affect. Her behavior is normal. Thought content normal.    ED Course  Procedures (including critical care time) Labs Reviewed - No data to display Dg Wrist Complete Left  03/12/2013   *RADIOLOGY REPORT*  Clinical Data: Wrist pain status post fall.  History of fracture and fixation.  LEFT WRIST - COMPLETE 3+ VIEW  Comparison: 07/02/2011.  Findings: Patient is status post plate and screw fixation of the distal radius.   The previously demonstrated fracture has healed. 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  No acute fracture, dislocation or focal soft tissue swelling is evident.  IMPRESSION: No acute osseous findings demonstrated.  The distal radial fracture has healed status post ORIF.  Nonunion of the ulnar styloid fracture.   Original Report Authenticated By: Carey Bullocks, M.D.   1. Left wrist pain     MDM  Patient presenting to the ED with left wrist pain after landing on the left hand in an outstretched manner while catching herself from a fall. Negative deformities noted, full ROM, sensation intact, strength intact, pulses palpable. Negative acute injuries noted to xray. Patient placed in velcro wrist brace. Patient stable, afebrile. Discharged patient. Discussed elevation, rest, ice. Discharged with pain medications - discussed course, precautions, disposal techniques. Referred to orthopedics. Discharged patient with pain medications - discussed precautions and disposal techniques. Discussed with patient to continue to monitor symptoms and if symptoms are to worsen or change to report back to the ED - strict return instructions given.  Patient agreed to plan of care, understood, all questions answered.   Raymon Mutton, PA-C 03/12/13 2321

## 2013-03-13 NOTE — ED Provider Notes (Signed)
Medical screening examination/treatment/procedure(s) were performed by non-physician practitioner and as supervising physician I was immediately available for consultation/collaboration.   Charles B. Sheldon, MD 03/13/13 1251 

## 2013-03-26 ENCOUNTER — Emergency Department (HOSPITAL_BASED_OUTPATIENT_CLINIC_OR_DEPARTMENT_OTHER): Payer: Medicaid Other

## 2013-03-26 ENCOUNTER — Encounter (HOSPITAL_BASED_OUTPATIENT_CLINIC_OR_DEPARTMENT_OTHER): Payer: Self-pay | Admitting: *Deleted

## 2013-03-26 ENCOUNTER — Emergency Department (HOSPITAL_BASED_OUTPATIENT_CLINIC_OR_DEPARTMENT_OTHER)
Admission: EM | Admit: 2013-03-26 | Discharge: 2013-03-26 | Disposition: A | Discharge status: Home / Self Care | Payer: Medicaid Other | Attending: Emergency Medicine | Admitting: Emergency Medicine

## 2013-03-26 DIAGNOSIS — Y9301 Activity, walking, marching and hiking: Secondary | ICD-10-CM | POA: Insufficient documentation

## 2013-03-26 DIAGNOSIS — F319 Bipolar disorder, unspecified: Secondary | ICD-10-CM | POA: Insufficient documentation

## 2013-03-26 DIAGNOSIS — Z79899 Other long term (current) drug therapy: Secondary | ICD-10-CM | POA: Insufficient documentation

## 2013-03-26 DIAGNOSIS — R21 Rash and other nonspecific skin eruption: Secondary | ICD-10-CM | POA: Insufficient documentation

## 2013-03-26 DIAGNOSIS — S93409A Sprain of unspecified ligament of unspecified ankle, initial encounter: Secondary | ICD-10-CM | POA: Insufficient documentation

## 2013-03-26 DIAGNOSIS — L259 Unspecified contact dermatitis, unspecified cause: Secondary | ICD-10-CM | POA: Insufficient documentation

## 2013-03-26 DIAGNOSIS — X500XXA Overexertion from strenuous movement or load, initial encounter: Secondary | ICD-10-CM | POA: Insufficient documentation

## 2013-03-26 DIAGNOSIS — L309 Dermatitis, unspecified: Secondary | ICD-10-CM

## 2013-03-26 DIAGNOSIS — Y929 Unspecified place or not applicable: Secondary | ICD-10-CM | POA: Insufficient documentation

## 2013-03-26 DIAGNOSIS — Z8679 Personal history of other diseases of the circulatory system: Secondary | ICD-10-CM | POA: Insufficient documentation

## 2013-03-26 DIAGNOSIS — F411 Generalized anxiety disorder: Secondary | ICD-10-CM | POA: Insufficient documentation

## 2013-03-26 DIAGNOSIS — Z8739 Personal history of other diseases of the musculoskeletal system and connective tissue: Secondary | ICD-10-CM | POA: Insufficient documentation

## 2013-03-26 MED ORDER — HYDROCODONE-ACETAMINOPHEN 5-325 MG PO TABS: 1.0000 | ORAL_TABLET | Freq: Four times a day (QID) | ORAL | Status: DC | PRN

## 2013-03-26 MED ORDER — HYDROCODONE-ACETAMINOPHEN 5-325 MG PO TABS
2.0000 | ORAL_TABLET | Freq: Once | ORAL | Status: AC
  Administered 2013-03-26: 2 via ORAL
  Filled 2013-03-26: qty 2

## 2013-03-26 MED ORDER — IBUPROFEN 400 MG PO TABS
600.0000 mg | ORAL_TABLET | Freq: Once | ORAL | Status: AC
  Administered 2013-03-26: 600 mg via ORAL
  Filled 2013-03-26: qty 1

## 2013-03-26 MED ORDER — PREDNISONE 50 MG PO TABS: 50.0000 mg | ORAL_TABLET | Freq: Every day | ORAL | Status: DC

## 2013-03-26 MED ORDER — IBUPROFEN 600 MG PO TABS: 600.0000 mg | ORAL_TABLET | Freq: Four times a day (QID) | ORAL | Status: DC | PRN

## 2013-03-26 MED ORDER — DIPHENHYDRAMINE HCL 25 MG PO CAPS: 25.0000 mg | ORAL_CAPSULE | Freq: Four times a day (QID) | ORAL | Status: DC | PRN

## 2013-03-26 NOTE — ED Notes (Signed)
Patient states she was walking yesterday and her shoe came apart and she twisted her right ankle.  States has pain and swelling in her right ankle.  Also, she has a rash on the back of her legs for the last four days, which is spreading and itchy.

## 2013-03-26 NOTE — ED Provider Notes (Signed)
I forgot to give patient Ortho f/u. I tried calling the number provided, and there is no response. Will try again later.  Derwood Kaplan, MD 03/26/13 1340

## 2013-03-26 NOTE — ED Notes (Signed)
Applied air cast to patient's right ankle.  Patient provided with crutches instruction & education; return demonstration from patient for both air cast & crutches.

## 2013-03-26 NOTE — ED Provider Notes (Signed)
CSN: [id number]     Arrival date & time 03/26/13  0935 History     First MD Initiated Contact with Patient 03/26/13 [phone]     Chief Complaint  Patient presents with  . Ankle Injury  . Rash   (Consider location/radiation/quality/duration/timing/severity/associated sxs/prior Treatment) HPI Comments: Pt is a 37 year old female who presents to the ED with right ankle pain/swelling and rash. Pt states that she was out on a concrete walking trail yesterday when she slid out of her R shoe and twisted her R ankle. She applied ice to the ankle and took ibuprofen at home, but the morning the pain and swelling were worse. She states that the pain is present on the lateral and medical sides of the ankle with radiation to the lower calf. She is unable to ambulate and does not have crutches. She has sprained the ankle in the past before. She denies fever, chills, or erythema of the joint.  The pt also has an erythematous, raised rash on the back of her L thigh that has been present for four days and is worsening. She first noticed one spot but now reports several more. The rash is pruritic. She has applied nystatin cream at home without improvement. She denies any exposure to insect bites, poisonous plants, or new laundry detergents or lotions. She has a daughter who sleeps in the same bed as her who does not have a rash. She denies any swelling of the affected area, sneezing, watery eyes, runny nose, or difficulty breathing. NKDA.  Patient is a 37 y.o. female presenting with lower extremity injury and rash. The history is provided by the patient.  Ankle Injury  Rash Associated symptoms: joint pain   Associated symptoms: no fever and not wheezing     Past Medical History  Diagnosis Date  . Migraines   . Anxiety   . Depression   . Fibromyalgia   . Fibromyalgia   . Migraine   . Depression   . Seizures 2002    no reason-none since  . Complication of anesthesia     she bleed alot after c-sec-hard to  clot-no other time-no work-up  . Bipolar disorder    Past Surgical History  Procedure Laterality Date  . Cesarean section    . Adenoidectomy    . Wisdom tooth extraction    . Eardrum repair    . Orif radial fracture  07/06/2011    Procedure: OPEN REDUCTION INTERNAL FIXATION (ORIF) RADIAL FRACTURE;  Surgeon: Tami Ribas;  Location: [facility];  Service: Orthopedics;  Laterality: Left;  ORIF LEFT DISTAL RADIUS   No family history on file. History  Substance Use Topics  . Smoking status: Never Smoker   . Smokeless tobacco: Never Used  . Alcohol Use: No   OB History   Grav Para Term Preterm Abortions TAB SAB Ect Mult Living                 Review of Systems  Constitutional: Negative for fever.  Respiratory: Negative for wheezing.   Genitourinary: Negative for hematuria and difficulty urinating.  Musculoskeletal: Positive for arthralgias.  Skin: Positive for rash. Negative for color change.  Neurological: Negative for speech difficulty and weakness.  Hematological: Does not bruise/bleed easily.  Psychiatric/Behavioral: Negative for confusion.    Allergies  Sulfa antibiotics  Home Medications   Current Outpatient Rx  Name  Route  Sig  Dispense  Refill  . traZODone (DESYREL) 100 MG tablet   Oral  Take 100 mg by mouth at bedtime.         Marland Kitchen adapalene (DIFFERIN) 0.1 % gel   Topical   Apply topically at bedtime.          Marland Kitchen EXPIRED: ARIPiprazole (ABILIFY) 5 MG tablet   Oral   Take 0.5 tablets (2.5 mg total) by mouth daily.   30 tablet   0   . DULoxetine (CYMBALTA) 60 MG capsule   Oral   Take 2 capsules (120 mg total) by mouth every morning.   60 capsule   0   . HYDROcodone-acetaminophen (NORCO) 5-325 MG per tablet   Oral   Take 1 tablet by mouth every 8 (eight) hours as needed for pain.   11 tablet   0   . HYDROcodone-acetaminophen (NORCO/VICODIN) 5-325 MG per tablet   Oral   Take 2 tablets by mouth every 4 (four) hours as needed for  pain.   10 tablet   0   . levonorgestrel (MIRENA) 20 MCG/24HR IUD   Intrauterine   1 each by Intrauterine route once.           . Multiple Vitamin (MULTIVITAMIN) tablet   Oral   Take 1 tablet by mouth daily.           Marland Kitchen omeprazole (PRILOSEC) 20 MG capsule   Oral   Take 1 capsule (20 mg total) by mouth daily.   20 capsule   0   . promethazine (PHENERGAN) 25 MG tablet   Oral   Take 1 tablet (25 mg total) by mouth every 6 (six) hours as needed for nausea.   12 tablet   0   . promethazine (PHENERGAN) 25 MG tablet   Oral   Take 1 tablet (25 mg total) by mouth every 6 (six) hours as needed for nausea.   20 tablet   0    BP 130/80  Pulse 96  Temp(Src) 98.4 F (36.9 C) (Oral)  Resp 16  Ht 5\' 1"  (1.549 m)  Wt 180 lb (81.647 kg)  BMI 34.03 kg/m2  SpO2 100% Physical Exam  Nursing note and vitals reviewed. Constitutional: She is oriented to person, place, and time. She appears well-developed and well-nourished.  HENT:  Head: Normocephalic and atraumatic.  Eyes: EOM are normal. Pupils are equal, round, and reactive to light.  Neck: Neck supple.  Cardiovascular: Normal rate, regular rhythm and normal heart sounds.   No murmur heard. Pulmonary/Chest: Effort normal. No respiratory distress.  Abdominal: Soft. She exhibits no distension. There is no tenderness. There is no rebound and no guarding.  Musculoskeletal:  Bimalleolar ankle swelling, lateral worse than medial, with tenderness with inversion and dorsiflexion. No deformity, Neurovascularly intact.  Neurological: She is alert and oriented to person, place, and time.  Skin: Skin is warm and dry. Rash noted.  Diffuse RLQ posterior erythematous, irregularly shaped, raised papules and plaque, no scales, no purulence, no surrounding erythema. Some of the lesions are blanching.     ED Course   Procedures (including critical care time)  Labs Reviewed - No data to display Dg Ankle Complete Right  03/26/2013    *RADIOLOGY REPORT*  Clinical Data: Pain post trauma  RIGHT ANKLE - COMPLETE 3+ VIEW  Comparison: None.  Findings: Frontal, oblique, and lateral views were obtained.  There is generalized soft tissue swelling.  No fracture or effusion. Ankle mortise appears intact.  There is an unfused apophysis along the posterior talus.  There is a minimal calcaneal spur inferiorly.  IMPRESSION:  No fracture or effusion.  Generalized swelling. Ankle mortise appears intact.   Original Report Authenticated By: Bretta Bang, M.D.   No diagnosis found.  MDM  Pt comes in with cc of rash and ankle pain.  Ankle pain - will get Xray, likely high grade sprain. Rash - unsure etiology. No hx of systemic dx, similar rash, or exposure. Appears to be contact dermatitis, with the itching, but other etiologies possible as well.  Will add steroids and benadryl, and patient to see her pcp if the sx persists or get worse.   Derwood Kaplan, MD 03/26/13 1056

## 2013-05-07 ENCOUNTER — Emergency Department (HOSPITAL_BASED_OUTPATIENT_CLINIC_OR_DEPARTMENT_OTHER)
Admission: EM | Admit: 2013-05-07 | Discharge: 2013-05-07 | Disposition: A | Discharge status: Home / Self Care | Payer: Medicaid Other | Attending: Emergency Medicine | Admitting: Emergency Medicine

## 2013-05-07 ENCOUNTER — Encounter (HOSPITAL_BASED_OUTPATIENT_CLINIC_OR_DEPARTMENT_OTHER): Payer: Self-pay | Admitting: Emergency Medicine

## 2013-05-07 DIAGNOSIS — Z79899 Other long term (current) drug therapy: Secondary | ICD-10-CM | POA: Insufficient documentation

## 2013-05-07 DIAGNOSIS — L02619 Cutaneous abscess of unspecified foot: Secondary | ICD-10-CM

## 2013-05-07 DIAGNOSIS — L02419 Cutaneous abscess of limb, unspecified: Secondary | ICD-10-CM | POA: Insufficient documentation

## 2013-05-07 DIAGNOSIS — F411 Generalized anxiety disorder: Secondary | ICD-10-CM | POA: Insufficient documentation

## 2013-05-07 DIAGNOSIS — Z8679 Personal history of other diseases of the circulatory system: Secondary | ICD-10-CM | POA: Insufficient documentation

## 2013-05-07 DIAGNOSIS — IMO0001 Reserved for inherently not codable concepts without codable children: Secondary | ICD-10-CM | POA: Insufficient documentation

## 2013-05-07 DIAGNOSIS — Z8669 Personal history of other diseases of the nervous system and sense organs: Secondary | ICD-10-CM | POA: Insufficient documentation

## 2013-05-07 DIAGNOSIS — IMO0002 Reserved for concepts with insufficient information to code with codable children: Secondary | ICD-10-CM | POA: Insufficient documentation

## 2013-05-07 DIAGNOSIS — F319 Bipolar disorder, unspecified: Secondary | ICD-10-CM | POA: Insufficient documentation

## 2013-05-07 MED ORDER — OXYCODONE-ACETAMINOPHEN 5-325 MG PO TABS
1.0000 | ORAL_TABLET | Freq: Once | ORAL | Status: AC
  Administered 2013-05-07: 1 via ORAL
  Filled 2013-05-07 (×2): qty 1

## 2013-05-07 MED ORDER — OXYCODONE-ACETAMINOPHEN 5-325 MG PO TABS: 1.0000 | ORAL_TABLET | ORAL | Status: DC | PRN

## 2013-05-07 NOTE — ED Notes (Signed)
Pt ambulatory to room no acute distress.  Seen at another facility for same Sunday. Got I&D R foot and Rx clindamycin

## 2013-05-07 NOTE — ED Provider Notes (Signed)
CSN: [id number]     Arrival date & time 05/07/13  1405 History   First MD Initiated Contact with Patient 05/07/13 1422     Chief Complaint  Patient presents with  . Abscess    R foot   (Consider location/radiation/quality/duration/timing/severity/associated sxs/prior Treatment) Patient is a 37 y.o. female presenting with abscess. The history is provided by the patient. No language interpreter was used.  Abscess Location:  Foot Foot abscess location:  Top of R foot Abscess quality: not draining   Associated symptoms: no fever and no nausea   Associated symptoms comment:  Painful, swollen and red area to top of right foot. She was seen and diagnosed with abscess 3 days ago, and treated with Clindamycin and I&D 2 days ago. She is here today with complaint of worsening symptoms. No fever.    Past Medical History  Diagnosis Date  . Migraines   . Anxiety   . Depression   . Fibromyalgia   . Fibromyalgia   . Migraine   . Depression   . Seizures 2002    no reason-none since  . Complication of anesthesia     she bleed alot after c-sec-hard to clot-no other time-no work-up  . Bipolar disorder    Past Surgical History  Procedure Laterality Date  . Cesarean section    . Adenoidectomy    . Wisdom tooth extraction    . Eardrum repair    . Orif radial fracture  07/06/2011    Procedure: OPEN REDUCTION INTERNAL FIXATION (ORIF) RADIAL FRACTURE;  Surgeon: Tami Ribas;  Location: [facility];  Service: Orthopedics;  Laterality: Left;  ORIF LEFT DISTAL RADIUS   History reviewed. No pertinent family history. History  Substance Use Topics  . Smoking status: Never Smoker   . Smokeless tobacco: Never Used  . Alcohol Use: No   OB History   Grav Para Term Preterm Abortions TAB SAB Ect Mult Living                 Review of Systems  Constitutional: Negative for fever and chills.  Gastrointestinal: Negative for nausea.  Musculoskeletal: Negative.  Negative for myalgias.   Skin:       C/O Abscess.    Allergies  Sulfa antibiotics  Home Medications   Current Outpatient Rx  Name  Route  Sig  Dispense  Refill  . clindamycin (CLEOCIN) 300 MG capsule   Oral   Take 300 mg by mouth 3 (three) times daily.         Marland Kitchen adapalene (DIFFERIN) 0.1 % gel   Topical   Apply topically at bedtime.          Marland Kitchen EXPIRED: ARIPiprazole (ABILIFY) 5 MG tablet   Oral   Take 0.5 tablets (2.5 mg total) by mouth daily.   30 tablet   0   . diphenhydrAMINE (BENADRYL) 25 mg capsule   Oral   Take 1 capsule (25 mg total) by mouth every 6 (six) hours as needed for itching.   30 capsule   0   . DULoxetine (CYMBALTA) 60 MG capsule   Oral   Take 2 capsules (120 mg total) by mouth every morning.   60 capsule   0   . HYDROcodone-acetaminophen (NORCO) 5-325 MG per tablet   Oral   Take 1 tablet by mouth every 8 (eight) hours as needed for pain.   11 tablet   0   . HYDROcodone-acetaminophen (NORCO/VICODIN) 5-325 MG per tablet   Oral  Take 2 tablets by mouth every 4 (four) hours as needed for pain.   10 tablet   0   . HYDROcodone-acetaminophen (NORCO/VICODIN) 5-325 MG per tablet   Oral   Take 1 tablet by mouth every 6 (six) hours as needed for pain.   6 tablet   0   . ibuprofen (ADVIL,MOTRIN) 600 MG tablet   Oral   Take 1 tablet (600 mg total) by mouth every 6 (six) hours as needed for pain.   30 tablet   0   . levonorgestrel (MIRENA) 20 MCG/24HR IUD   Intrauterine   1 each by Intrauterine route once.           . Multiple Vitamin (MULTIVITAMIN) tablet   Oral   Take 1 tablet by mouth daily.           Marland Kitchen omeprazole (PRILOSEC) 20 MG capsule   Oral   Take 1 capsule (20 mg total) by mouth daily.   20 capsule   0   . predniSONE (DELTASONE) 50 MG tablet   Oral   Take 1 tablet (50 mg total) by mouth daily.   5 tablet   0   . promethazine (PHENERGAN) 25 MG tablet   Oral   Take 1 tablet (25 mg total) by mouth every 6 (six) hours as needed for  nausea.   12 tablet   0   . promethazine (PHENERGAN) 25 MG tablet   Oral   Take 1 tablet (25 mg total) by mouth every 6 (six) hours as needed for nausea.   20 tablet   0   . traZODone (DESYREL) 100 MG tablet   Oral   Take 100 mg by mouth at bedtime.          BP 113/83  Pulse 106  Temp(Src) 98.8 F (37.1 C) (Oral)  Resp 16  Ht 5\' 1"  (1.549 m)  Wt 170 lb (77.111 kg)  BMI 32.14 kg/m2  SpO2 98% Physical Exam  Constitutional: She appears well-developed and well-nourished. No distress.  Skin:  Tenderness to swollen and red area dorsum right foot at mid-foot c/w cutaneous abscess. No active drainage.     ED Course  Procedures (including critical care time) Labs Review Labs Reviewed - No data to display Imaging Review No results found. INCISION AND DRAINAGE Performed by: Elpidio Anis A Consent: Verbal consent obtained. Risks and benefits: risks, benefits and alternatives were discussed Type: abscess  Body area: right foot  Anesthesia: local infiltration  Incision was made with a scalpel.  Local anesthetic: lidocaine 2% w/o epinephrine  Anesthetic total: 1 ml  Complexity: complex Blunt dissection to break up loculations  Drainage: purulent  Drainage amount: small  Packing material: 1/4 in iodoform gauze  Patient tolerance: Patient tolerated the procedure well with no immediate complications.    MDM  No diagnosis found. 1. Cutaneous abscess  I&D performed with minimal drainage. Packed to prevent re-accumulation. Recheck in 2 days.     Arnoldo Hooker, PA-C 05/07/13 1524

## 2013-05-08 NOTE — ED Provider Notes (Signed)
Medical screening examination/treatment/procedure(s) were performed by non-physician practitioner and as supervising physician I was immediately available for consultation/collaboration.   Malina Geers, MD 05/08/13 0807 

## 2013-05-29 ENCOUNTER — Encounter (HOSPITAL_BASED_OUTPATIENT_CLINIC_OR_DEPARTMENT_OTHER): Payer: Self-pay | Admitting: Emergency Medicine

## 2013-05-29 ENCOUNTER — Emergency Department (HOSPITAL_BASED_OUTPATIENT_CLINIC_OR_DEPARTMENT_OTHER)
Admission: EM | Admit: 2013-05-29 | Discharge: 2013-05-29 | Disposition: A | Discharge status: Home / Self Care | Payer: Medicaid Other | Attending: Emergency Medicine | Admitting: Emergency Medicine

## 2013-05-29 DIAGNOSIS — N390 Urinary tract infection, site not specified: Secondary | ICD-10-CM

## 2013-05-29 DIAGNOSIS — Z8679 Personal history of other diseases of the circulatory system: Secondary | ICD-10-CM | POA: Insufficient documentation

## 2013-05-29 DIAGNOSIS — Z79899 Other long term (current) drug therapy: Secondary | ICD-10-CM | POA: Insufficient documentation

## 2013-05-29 DIAGNOSIS — Z3202 Encounter for pregnancy test, result negative: Secondary | ICD-10-CM | POA: Insufficient documentation

## 2013-05-29 DIAGNOSIS — R1110 Vomiting, unspecified: Secondary | ICD-10-CM | POA: Insufficient documentation

## 2013-05-29 DIAGNOSIS — Z792 Long term (current) use of antibiotics: Secondary | ICD-10-CM | POA: Insufficient documentation

## 2013-05-29 DIAGNOSIS — F319 Bipolar disorder, unspecified: Secondary | ICD-10-CM | POA: Insufficient documentation

## 2013-05-29 DIAGNOSIS — R509 Fever, unspecified: Secondary | ICD-10-CM | POA: Insufficient documentation

## 2013-05-29 DIAGNOSIS — Z8669 Personal history of other diseases of the nervous system and sense organs: Secondary | ICD-10-CM | POA: Insufficient documentation

## 2013-05-29 DIAGNOSIS — F411 Generalized anxiety disorder: Secondary | ICD-10-CM | POA: Insufficient documentation

## 2013-05-29 DIAGNOSIS — IMO0001 Reserved for inherently not codable concepts without codable children: Secondary | ICD-10-CM | POA: Insufficient documentation

## 2013-05-29 LAB — URINE MICROSCOPIC-ADD ON

## 2013-05-29 LAB — URINALYSIS, ROUTINE W REFLEX MICROSCOPIC
Bilirubin Urine: NEGATIVE
Glucose, UA: NEGATIVE mg/dL
Hgb urine dipstick: NEGATIVE
Specific Gravity, Urine: 1.012 (ref 1.005–1.030)

## 2013-05-29 MED ORDER — HYDROMORPHONE HCL PF 1 MG/ML IJ SOLN
1.0000 mg | Freq: Once | INTRAMUSCULAR | Status: AC
  Administered 2013-05-29: 1 mg via INTRAVENOUS
  Filled 2013-05-29: qty 1

## 2013-05-29 MED ORDER — HYDROCODONE-ACETAMINOPHEN 5-325 MG PO TABS: 2.0000 | ORAL_TABLET | ORAL | Status: DC | PRN

## 2013-05-29 MED ORDER — SODIUM CHLORIDE 0.9 % IV SOLN
Freq: Once | INTRAVENOUS | Status: AC
  Administered 2013-05-29: 14:00:00 via INTRAVENOUS

## 2013-05-29 MED ORDER — PROMETHAZINE HCL 25 MG PO TABS: 25.0000 mg | ORAL_TABLET | Freq: Four times a day (QID) | ORAL | Status: DC | PRN

## 2013-05-29 MED ORDER — DEXTROSE 5 % IV SOLN
1.0000 g | INTRAVENOUS | Status: DC
  Administered 2013-05-29: 1 g via INTRAVENOUS

## 2013-05-29 MED ORDER — PROMETHAZINE HCL 25 MG/ML IJ SOLN
12.5000 mg | Freq: Once | INTRAMUSCULAR | Status: AC
  Administered 2013-05-29: 12.5 mg via INTRAVENOUS
  Filled 2013-05-29: qty 1

## 2013-05-29 MED ORDER — CIPROFLOXACIN HCL 500 MG PO TABS: 500.0000 mg | ORAL_TABLET | Freq: Two times a day (BID) | ORAL | Status: DC

## 2013-05-29 NOTE — ED Provider Notes (Signed)
CSN: [id number]     Arrival date & time 05/29/13  1224 History   First MD Initiated Contact with Patient 05/29/13 1344     Chief Complaint  Patient presents with  . Fever  . Emesis  . Flank Pain   (Consider location/radiation/quality/duration/timing/severity/associated sxs/prior Treatment) Patient is a 37 y.o. female presenting with frequency. The history is provided by the patient. No language interpreter was used.  Urinary Frequency This is a new problem. The current episode started today. The problem occurs constantly. Associated symptoms include abdominal pain and a fever. Nothing aggravates the symptoms. She has tried nothing for the symptoms. The treatment provided no relief.    Past Medical History  Diagnosis Date  . Migraines   . Anxiety   . Depression   . Fibromyalgia   . Fibromyalgia   . Migraine   . Depression   . Seizures 2002    no reason-none since  . Complication of anesthesia     she bleed alot after c-sec-hard to clot-no other time-no work-up  . Bipolar disorder    Past Surgical History  Procedure Laterality Date  . Cesarean section    . Adenoidectomy    . Wisdom tooth extraction    . Eardrum repair    . Orif radial fracture  07/06/2011    Procedure: OPEN REDUCTION INTERNAL FIXATION (ORIF) RADIAL FRACTURE;  Surgeon: Tami Ribas;  Location: [facility];  Service: Orthopedics;  Laterality: Left;  ORIF LEFT DISTAL RADIUS   No family history on file. History  Substance Use Topics  . Smoking status: Never Smoker   . Smokeless tobacco: Never Used  . Alcohol Use: No   OB History   Grav Para Term Preterm Abortions TAB SAB Ect Mult Living                 Review of Systems  Constitutional: Positive for fever.  Gastrointestinal: Positive for abdominal pain.  Genitourinary: Positive for frequency.  All other systems reviewed and are negative.    Allergies  Sulfa antibiotics  Home Medications   Current Outpatient Rx  Name  Route   Sig  Dispense  Refill  . adapalene (DIFFERIN) 0.1 % gel   Topical   Apply topically at bedtime.          . DULoxetine (CYMBALTA) 60 MG capsule   Oral   Take 2 capsules (120 mg total) by mouth every morning.   60 capsule   0   . ibuprofen (ADVIL,MOTRIN) 600 MG tablet   Oral   Take 1 tablet (600 mg total) by mouth every 6 (six) hours as needed for pain.   30 tablet   0   . levonorgestrel (MIRENA) 20 MCG/24HR IUD   Intrauterine   1 each by Intrauterine route once.           . Multiple Vitamin (MULTIVITAMIN) tablet   Oral   Take 1 tablet by mouth daily.           Marland Kitchen omeprazole (PRILOSEC) 20 MG capsule   Oral   Take 1 capsule (20 mg total) by mouth daily.   20 capsule   0   . traZODone (DESYREL) 100 MG tablet   Oral   Take 100 mg by mouth at bedtime.         Marland Kitchen EXPIRED: ARIPiprazole (ABILIFY) 5 MG tablet   Oral   Take 0.5 tablets (2.5 mg total) by mouth daily.   30 tablet   0   .  clindamycin (CLEOCIN) 300 MG capsule   Oral   Take 300 mg by mouth 3 (three) times daily.          BP 128/78  Pulse 94  Temp(Src) 99.5 F (37.5 C) (Oral)  Resp 18  Ht 5\' 1"  (1.549 m)  Wt 171 lb (77.565 kg)  BMI 32.33 kg/m2  SpO2 100% Physical Exam  Nursing note and vitals reviewed. Constitutional: She is oriented to person, place, and time. She appears well-developed and well-nourished.  HENT:  Head: Normocephalic.  Eyes: Conjunctivae and EOM are normal. Pupils are equal, round, and reactive to light.  Neck: Normal range of motion.  Cardiovascular: Normal rate.   Pulmonary/Chest: Effort normal.  Abdominal: Soft. She exhibits no distension.  Musculoskeletal: Normal range of motion.  Neurological: She is alert and oriented to person, place, and time.  Psychiatric: She has a normal mood and affect.    ED Course  Procedures (including critical care time) Labs Review Labs Reviewed  URINALYSIS, ROUTINE W REFLEX MICROSCOPIC - Abnormal; Notable for the following:     APPearance CLOUDY (*)    Urobilinogen, UA 2.0 (*)    Leukocytes, UA LARGE (*)    All other components within normal limits  URINE MICROSCOPIC-ADD ON - Abnormal; Notable for the following:    Squamous Epithelial / LPF FEW (*)    Bacteria, UA MANY (*)    All other components within normal limits  PREGNANCY, URINE   Imaging Review No results found.  EKG Interpretation   None       MDM   1. UTI (lower urinary tract infection)    Pt given rx for hydrocodone, phenergan and cipro.   Pt advised to return if any problems.    Lonia Skinner [place], PA-C 05/31/13 [phone]

## 2013-05-29 NOTE — ED Notes (Signed)
Left flank pain, vomiting, chills, body aches, chills and fever x 1 week.

## 2013-05-31 ENCOUNTER — Telehealth (HOSPITAL_COMMUNITY): Payer: Self-pay | Admitting: Emergency Medicine

## 2013-05-31 LAB — URINE CULTURE: Colony Count: >=100000

## 2013-05-31 NOTE — ED Notes (Signed)
Post ED Visit - Positive Culture Follow-up  Culture report reviewed by antimicrobial stewardship pharmacist: []  Wes Dulaney, Pharm.D., BCPS [x]  Celedonio Miyamoto, Pharm.D., BCPS []  Georgina Pillion, Pharm.D., BCPS []  [place], [address].D., BCPS, AAHIVP []  Estella Husk, Pharm.D., BCPS, AAHIVP  Positive urine culture Treated with Cipro, organism sensitive to the same and no further patient follow-up is required at this time.  Kylie A Holland 05/31/2013, 3:21 PM

## 2013-06-01 ENCOUNTER — Telehealth (HOSPITAL_COMMUNITY): Payer: Self-pay | Admitting: Emergency Medicine

## 2013-06-01 NOTE — ED Notes (Signed)
Post ED Visit - Positive Culture Follow-up  Culture report reviewed by antimicrobial stewardship pharmacist: []  Wes Dulaney, Pharm.D., BCPS [x]  Celedonio Miyamoto, [address].D., BCPS []  Georgina Pillion, Pharm.D., BCPS []  [place], [place].D., BCPS, AAHIVP []  Estella Husk, Pharm.D., BCPS, AAHIVP  Positive urine culture Treated with Cipro, organism sensitive to the same and no further patient follow-up is required at this time.  Kylie A Holland 06/01/2013, 11:59 AM

## 2013-06-02 NOTE — ED Provider Notes (Signed)
Medical screening examination/treatment/procedure(s) were performed by non-physician practitioner and as supervising physician I was immediately available for consultation/collaboration.  Derwood Kaplan, MD 06/02/13 [phone]

## 2013-07-03 ENCOUNTER — Emergency Department (HOSPITAL_BASED_OUTPATIENT_CLINIC_OR_DEPARTMENT_OTHER): Payer: Medicaid Other

## 2013-07-03 ENCOUNTER — Emergency Department (HOSPITAL_BASED_OUTPATIENT_CLINIC_OR_DEPARTMENT_OTHER)
Admission: EM | Admit: 2013-07-03 | Discharge: 2013-07-04 | Disposition: A | Discharge status: Home / Self Care | Payer: Medicaid Other | Attending: Emergency Medicine | Admitting: Emergency Medicine

## 2013-07-03 DIAGNOSIS — Z87440 Personal history of urinary (tract) infections: Secondary | ICD-10-CM | POA: Insufficient documentation

## 2013-07-03 DIAGNOSIS — Z792 Long term (current) use of antibiotics: Secondary | ICD-10-CM | POA: Insufficient documentation

## 2013-07-03 DIAGNOSIS — J3489 Other specified disorders of nose and nasal sinuses: Secondary | ICD-10-CM | POA: Insufficient documentation

## 2013-07-03 DIAGNOSIS — R109 Unspecified abdominal pain: Secondary | ICD-10-CM

## 2013-07-03 DIAGNOSIS — Z8739 Personal history of other diseases of the musculoskeletal system and connective tissue: Secondary | ICD-10-CM | POA: Insufficient documentation

## 2013-07-03 DIAGNOSIS — R6883 Chills (without fever): Secondary | ICD-10-CM | POA: Insufficient documentation

## 2013-07-03 DIAGNOSIS — F319 Bipolar disorder, unspecified: Secondary | ICD-10-CM | POA: Insufficient documentation

## 2013-07-03 DIAGNOSIS — F411 Generalized anxiety disorder: Secondary | ICD-10-CM | POA: Insufficient documentation

## 2013-07-03 DIAGNOSIS — Z8679 Personal history of other diseases of the circulatory system: Secondary | ICD-10-CM | POA: Insufficient documentation

## 2013-07-03 DIAGNOSIS — N83209 Unspecified ovarian cyst, unspecified side: Secondary | ICD-10-CM

## 2013-07-03 DIAGNOSIS — Z791 Long term (current) use of non-steroidal anti-inflammatories (NSAID): Secondary | ICD-10-CM | POA: Insufficient documentation

## 2013-07-03 DIAGNOSIS — Z79899 Other long term (current) drug therapy: Secondary | ICD-10-CM | POA: Insufficient documentation

## 2013-07-03 DIAGNOSIS — Z3202 Encounter for pregnancy test, result negative: Secondary | ICD-10-CM | POA: Insufficient documentation

## 2013-07-03 DIAGNOSIS — Z8669 Personal history of other diseases of the nervous system and sense organs: Secondary | ICD-10-CM | POA: Insufficient documentation

## 2013-07-03 LAB — URINALYSIS, ROUTINE W REFLEX MICROSCOPIC
Bilirubin Urine: NEGATIVE
Hgb urine dipstick: NEGATIVE
Nitrite: NEGATIVE
Specific Gravity, Urine: 1.011 (ref 1.005–1.030)
Urobilinogen, UA: 1 mg/dL (ref 0.0–1.0)
pH: 6.5 (ref 5.0–8.0)

## 2013-07-03 LAB — CBC WITH DIFFERENTIAL/PLATELET
Basophils Absolute: 0 10*3/uL (ref 0.0–0.1)
Basophils Relative: 0 % (ref 0–1)
Eosinophils Relative: 1 % (ref 0–5)
Lymphocytes Relative: 29 % (ref 12–46)
Lymphs Abs: 2.6 10*3/uL (ref 0.7–4.0)
MCH: 30.6 pg (ref 26.0–34.0)
MCV: 90.6 fL (ref 78.0–100.0)
Neutro Abs: 5.6 10*3/uL (ref 1.7–7.7)
Platelets: 276 10*3/uL (ref 150–400)
RBC: 3.92 MIL/uL (ref 3.87–5.11)
RDW: 13.3 % (ref 11.5–15.5)
WBC: 9 10*3/uL (ref 4.0–10.5)

## 2013-07-03 LAB — COMPREHENSIVE METABOLIC PANEL
ALT: 9 U/L (ref 0–35)
AST: 13 U/L (ref 0–37)
Alkaline Phosphatase: 61 U/L (ref 39–117)
CO2: 26 mEq/L (ref 19–32)
Calcium: 9.3 mg/dL (ref 8.4–10.5)
Chloride: 100 mEq/L (ref 96–112)
GFR calc Af Amer: 84 mL/min — ABNORMAL LOW (ref >90)
GFR calc non Af Amer: 72 mL/min — ABNORMAL LOW (ref >90)
Glucose, Bld: 103 mg/dL — ABNORMAL HIGH (ref 70–99)
Potassium: 3.6 mEq/L (ref 3.5–5.1)
Sodium: 136 mEq/L (ref 135–145)
Total Bilirubin: 0.5 mg/dL (ref 0.3–1.2)

## 2013-07-03 LAB — PREGNANCY, URINE: Preg Test, Ur: NEGATIVE

## 2013-07-03 MED ORDER — SODIUM CHLORIDE 0.9 % IV BOLUS (SEPSIS)
500.0000 mL | Freq: Once | INTRAVENOUS | Status: AC
  Administered 2013-07-03: 500 mL via INTRAVENOUS

## 2013-07-03 MED ORDER — IOHEXOL 300 MG/ML  SOLN
100.0000 mL | Freq: Once | INTRAMUSCULAR | Status: AC | PRN
  Administered 2013-07-03: 100 mL via INTRAVENOUS

## 2013-07-03 MED ORDER — HYDROMORPHONE HCL PF 1 MG/ML IJ SOLN
1.0000 mg | Freq: Once | INTRAMUSCULAR | Status: AC
  Administered 2013-07-03: 1 mg via INTRAVENOUS
  Filled 2013-07-03: qty 1

## 2013-07-03 MED ORDER — ONDANSETRON HCL 4 MG/2ML IJ SOLN
4.0000 mg | Freq: Once | INTRAMUSCULAR | Status: AC
  Administered 2013-07-03: 4 mg via INTRAVENOUS
  Filled 2013-07-03: qty 2

## 2013-07-03 MED ORDER — IOHEXOL 300 MG/ML  SOLN
50.0000 mL | Freq: Once | INTRAMUSCULAR | Status: AC | PRN
  Administered 2013-07-03: 50 mL via ORAL

## 2013-07-03 MED ORDER — SODIUM CHLORIDE 0.9 % IV SOLN
INTRAVENOUS | Status: DC
  Administered 2013-07-03: 23:00:00 via INTRAVENOUS

## 2013-07-03 MED ORDER — TRAMADOL HCL 50 MG PO TABS: 50.0000 mg | ORAL_TABLET | Freq: Four times a day (QID) | ORAL | Status: DC | PRN

## 2013-07-03 MED ORDER — NAPROXEN 500 MG PO TABS: 500.0000 mg | ORAL_TABLET | Freq: Two times a day (BID) | ORAL | Status: DC

## 2013-07-03 NOTE — ED Provider Notes (Addendum)
CSN: [id number]     Arrival date & time 07/03/13  1945 History  This chart was scribed for Shelly Jakes, MD by Ronal Fear, ED Scribe. This patient was seen in room MH02/MH02 and the patient's care was started at 10:00 PM.    Chief Complaint  Patient presents with  . Flank Pain   (Consider location/radiation/quality/duration/timing/severity/associated sxs/prior Treatment) Patient is a 37 y.o. female presenting with flank pain. The history is provided by the patient. No language interpreter was used.  Flank Pain This is a recurrent problem. The current episode started 6 to 12 hours ago. The problem occurs constantly. The problem has been gradually worsening. Pertinent negatives include no chest pain, no abdominal pain, no headaches and no shortness of breath. Nothing relieves the symptoms.  HPI Comments: Shelly Cummings is a 37 y.o. female who presents to the Emergency Department complaining of throbbing right lower back and flank pain onset 12 pm today that radiates to RLQ. Pt was placed on rosef IV and Cipro 2x a day for a UTI that she had 3x weeks ago with relief. She presents today with similar symptoms. She states that she has experienced some nausea and vomiting and some leakage of urine. Pt denies dysuria, blood in urine, fever, blood in throw up. Pt was seen here 3x weeks ago for a kidney infection .   Past Medical History  Diagnosis Date  . Migraines   . Anxiety   . Depression   . Fibromyalgia   . Fibromyalgia   . Migraine   . Depression   . Seizures 2002    no reason-none since  . Complication of anesthesia     she bleed alot after c-sec-hard to clot-no other time-no work-up  . Bipolar disorder    Past Surgical History  Procedure Laterality Date  . Cesarean section    . Adenoidectomy    . Wisdom tooth extraction    . Eardrum repair    . Orif radial fracture  07/06/2011    Procedure: OPEN REDUCTION INTERNAL FIXATION (ORIF) RADIAL FRACTURE;  Surgeon: Tami Ribas;   Location: [facility];  Service: Orthopedics;  Laterality: Left;  ORIF LEFT DISTAL RADIUS   No family history on file. History  Substance Use Topics  . Smoking status: Never Smoker   . Smokeless tobacco: Never Used  . Alcohol Use: No   OB History   Grav Para Term Preterm Abortions TAB SAB Ect Mult Living                 Review of Systems  Constitutional: Positive for chills. Negative for fever.  HENT: Positive for congestion and rhinorrhea. Negative for sore throat.   Eyes: Negative for visual disturbance.  Respiratory: Negative for cough and shortness of breath.   Cardiovascular: Negative for chest pain.  Gastrointestinal: Positive for nausea and vomiting. Negative for abdominal pain and diarrhea.  Genitourinary: Positive for flank pain. Negative for dysuria and hematuria.  Musculoskeletal: Negative for back pain and neck pain.  Skin: Negative for rash.  Neurological: Negative for headaches.  Hematological: Does not bruise/bleed easily.  All other systems reviewed and are negative.    Allergies  Sulfa antibiotics  Home Medications   Current Outpatient Rx  Name  Route  Sig  Dispense  Refill  . adapalene (DIFFERIN) 0.1 % gel   Topical   Apply topically at bedtime.          Marland Kitchen EXPIRED: ARIPiprazole (ABILIFY) 5 MG tablet  Oral   Take 0.5 tablets (2.5 mg total) by mouth daily.   30 tablet   0   . ciprofloxacin (CIPRO) 500 MG tablet   Oral   Take 1 tablet (500 mg total) by mouth 2 (two) times daily.   20 tablet   0   . clindamycin (CLEOCIN) 300 MG capsule   Oral   Take 300 mg by mouth 3 (three) times daily.         . DULoxetine (CYMBALTA) 60 MG capsule   Oral   Take 2 capsules (120 mg total) by mouth every morning.   60 capsule   0   . HYDROcodone-acetaminophen (NORCO/VICODIN) 5-325 MG per tablet   Oral   Take 2 tablets by mouth every 4 (four) hours as needed for pain.   10 tablet   0   . ibuprofen (ADVIL,MOTRIN) 600 MG tablet    Oral   Take 1 tablet (600 mg total) by mouth every 6 (six) hours as needed for pain.   30 tablet   0   . levonorgestrel (MIRENA) 20 MCG/24HR IUD   Intrauterine   1 each by Intrauterine route once.           . Multiple Vitamin (MULTIVITAMIN) tablet   Oral   Take 1 tablet by mouth daily.           . naproxen (NAPROSYN) 500 MG tablet   Oral   Take 1 tablet (500 mg total) by mouth 2 (two) times daily.   14 tablet   0   . omeprazole (PRILOSEC) 20 MG capsule   Oral   Take 1 capsule (20 mg total) by mouth daily.   20 capsule   0   . promethazine (PHENERGAN) 25 MG tablet   Oral   Take 1 tablet (25 mg total) by mouth every 6 (six) hours as needed for nausea.   10 tablet   0   . traMADol (ULTRAM) 50 MG tablet   Oral   Take 1 tablet (50 mg total) by mouth every 6 (six) hours as needed.   20 tablet   0   . traZODone (DESYREL) 100 MG tablet   Oral   Take 100 mg by mouth at bedtime.          BP 126/85  Pulse 96  Temp(Src) 98.4 F (36.9 C) (Oral)  Resp 16 Physical Exam  Nursing note and vitals reviewed. Constitutional: She is oriented to person, place, and time. She appears well-developed and well-nourished.  HENT:  Head: Normocephalic and atraumatic.  Eyes: Pupils are equal, round, and reactive to light.  Neck: Neck supple.  Cardiovascular: Normal rate, regular rhythm and normal heart sounds.   Pulmonary/Chest: Effort normal and breath sounds normal. No respiratory distress. She has no wheezes.  Abdominal: Soft. Bowel sounds are normal.  Mild tenderness in right flank area  Neurological: She is alert and oriented to person, place, and time.  Skin: Skin is warm and dry.  Psychiatric: She has a normal mood and affect.    ED Course  Procedures (including critical care time) DIAGNOSTIC STUDIES:   COORDINATION OF CARE:    10:17 PM- Pt advised of plan for treatment including CT of kidneys  and pt agrees.   Labs Review Labs Reviewed  CBC WITH DIFFERENTIAL  - Abnormal; Notable for the following:    HCT 35.5 (*)    All other components within normal limits  COMPREHENSIVE METABOLIC PANEL - Abnormal; Notable for the following:  Glucose, Bld 103 (*)    GFR calc non Af Amer 72 (*)    GFR calc Af Amer 84 (*)    All other components within normal limits  URINALYSIS, ROUTINE W REFLEX MICROSCOPIC  PREGNANCY, URINE  LIPASE, BLOOD   Results for orders placed during the hospital encounter of 07/03/13  URINALYSIS, ROUTINE W REFLEX MICROSCOPIC      Result Value Range   Color, Urine YELLOW  YELLOW   APPearance CLEAR  CLEAR   Specific Gravity, Urine 1.011  1.005 - 1.030   pH 6.5  5.0 - 8.0   Glucose, UA NEGATIVE  NEGATIVE mg/dL   Hgb urine dipstick NEGATIVE  NEGATIVE   Bilirubin Urine NEGATIVE  NEGATIVE   Ketones, ur NEGATIVE  NEGATIVE mg/dL   Protein, ur NEGATIVE  NEGATIVE mg/dL   Urobilinogen, UA 1.0  0.0 - 1.0 mg/dL   Nitrite NEGATIVE  NEGATIVE   Leukocytes, UA NEGATIVE  NEGATIVE  PREGNANCY, URINE      Result Value Range   Preg Test, Ur NEGATIVE  NEGATIVE  CBC WITH DIFFERENTIAL      Result Value Range   WBC 9.0  4.0 - 10.5 K/uL   RBC 3.92  3.87 - 5.11 MIL/uL   Hemoglobin 12.0  12.0 - 15.0 g/dL   HCT 82.9 (*) 56.2 - 13.0 %   MCV 90.6  78.0 - 100.0 fL   MCH 30.6  26.0 - 34.0 pg   MCHC 33.8  30.0 - 36.0 g/dL   RDW 86.5  78.4 - 69.6 %   Platelets 276  150 - 400 K/uL   Neutrophils Relative % 62  43 - 77 %   Neutro Abs 5.6  1.7 - 7.7 K/uL   Lymphocytes Relative 29  12 - 46 %   Lymphs Abs 2.6  0.7 - 4.0 K/uL   Monocytes Relative 8  3 - 12 %   Monocytes Absolute 0.7  0.1 - 1.0 K/uL   Eosinophils Relative 1  0 - 5 %   Eosinophils Absolute 0.1  0.0 - 0.7 K/uL   Basophils Relative 0  0 - 1 %   Basophils Absolute 0.0  0.0 - 0.1 K/uL  COMPREHENSIVE METABOLIC PANEL      Result Value Range   Sodium 136  135 - 145 mEq/L   Potassium 3.6  3.5 - 5.1 mEq/L   Chloride 100  96 - 112 mEq/L   CO2 26  19 - 32 mEq/L   Glucose, Bld 103 (*) 70 - 99  mg/dL   BUN 6  6 - 23 mg/dL   Creatinine, Ser 2.95  0.50 - 1.10 mg/dL   Calcium 9.3  8.4 - 28.4 mg/dL   Total Protein 7.3  6.0 - 8.3 g/dL   Albumin 3.6  3.5 - 5.2 g/dL   AST 13  0 - 37 U/L   ALT 9  0 - 35 U/L   Alkaline Phosphatase 61  39 - 117 U/L   Total Bilirubin 0.5  0.3 - 1.2 mg/dL   GFR calc non Af Amer 72 (*) >90 mL/min   GFR calc Af Amer 84 (*) >90 mL/min  LIPASE, BLOOD      Result Value Range   Lipase 32  11 - 59 U/L    Imaging Review Dg Chest 2 View  07/03/2013   CLINICAL DATA:  Throbbing right lower back and flank pain.  EXAM: CHEST  2 VIEW  COMPARISON:  Chest radiograph performed 07/25/2009  FINDINGS: The  lungs are well-aerated and clear. There is no evidence of focal opacification, pleural effusion or pneumothorax.  The heart is normal in size; the mediastinal contour is within normal limits. No acute osseous abnormalities are seen.  IMPRESSION: No active cardiopulmonary disease.   Electronically Signed   By: Roanna Raider M.D.   On: 07/03/2013 23:41   Ct Abdomen Pelvis W Contrast  07/03/2013   CLINICAL DATA:  Abdominal pain, nausea and vomiting. Previous C-section.  EXAM: CT ABDOMEN AND PELVIS WITH CONTRAST  TECHNIQUE: Multidetector CT imaging of the abdomen and pelvis was performed using the standard protocol following bolus administration of intravenous contrast.  CONTRAST:  50mL OMNIPAQUE IOHEXOL 300 MG/ML SOLN, OMNIPAQUE IOHEXOL 300 MG/ML SOLN  COMPARISON:  02/04/2011.  FINDINGS: Normal appearing liver, spleen, pancreas, gallbladder, adrenal glands, kidneys, urinary bladder and left ovary. 2.9 cm simple appearing right ovarian cyst. Intrauterine device within the endometrial canal. No gastrointestinal abnormalities or enlarged lymph nodes. Normal appearing appendix. Clear lung bases. Unremarkable bones.  IMPRESSION: Normal examination.   Electronically Signed   By: Gordan Payment M.D.   On: 07/03/2013 23:35    EKG Interpretation   None       MDM   1. Abdominal  pain   2. Ovarian cyst    Patient's symptoms clinically was suspicious for gallbladder disease. However no evidence of stones. Pain was mostly in the right upper quadrant. No evidence to urinalysis no evidence of kidney stone. Patient without leukocytosis no liver function test abnormalities lipase is normal not consistent with pancreatitis. Patient's pregnancy test is negative. Symptoms could be related to the ovarian cyst. Will treat with anti-inflammatories. In addition the appendix was normal.  In addition chest x-rays negative for pneumonia pneumothorax or pulmonary edema.  I personally performed the services described in this documentation, which was scribed in my presence. The recorded information has been reviewed and is accurate.     Shelly Jakes, MD 07/03/13 1610  Shelly Jakes, MD 07/03/13 [phone]

## 2013-07-03 NOTE — ED Notes (Signed)
MD at bedside. 

## 2013-07-03 NOTE — ED Notes (Signed)
Ct notified pt has completed oral contrast.  

## 2013-07-03 NOTE — ED Notes (Signed)
Brought in by ems from home c/o right flank pain radiates into abd , seen here 3 weeks ago DX UTI

## 2013-08-07 ENCOUNTER — Encounter (HOSPITAL_BASED_OUTPATIENT_CLINIC_OR_DEPARTMENT_OTHER): Payer: Self-pay | Admitting: Emergency Medicine

## 2013-08-07 ENCOUNTER — Emergency Department (HOSPITAL_BASED_OUTPATIENT_CLINIC_OR_DEPARTMENT_OTHER)
Admission: EM | Admit: 2013-08-07 | Discharge: 2013-08-07 | Disposition: A | Discharge status: Home / Self Care | Payer: Medicaid Other | Attending: Emergency Medicine | Admitting: Emergency Medicine

## 2013-08-07 DIAGNOSIS — Z792 Long term (current) use of antibiotics: Secondary | ICD-10-CM | POA: Insufficient documentation

## 2013-08-07 DIAGNOSIS — L02519 Cutaneous abscess of unspecified hand: Secondary | ICD-10-CM | POA: Insufficient documentation

## 2013-08-07 DIAGNOSIS — L02512 Cutaneous abscess of left hand: Secondary | ICD-10-CM

## 2013-08-07 DIAGNOSIS — Z8739 Personal history of other diseases of the musculoskeletal system and connective tissue: Secondary | ICD-10-CM | POA: Insufficient documentation

## 2013-08-07 DIAGNOSIS — L03019 Cellulitis of unspecified finger: Secondary | ICD-10-CM | POA: Insufficient documentation

## 2013-08-07 DIAGNOSIS — Z79899 Other long term (current) drug therapy: Secondary | ICD-10-CM | POA: Insufficient documentation

## 2013-08-07 DIAGNOSIS — Z8679 Personal history of other diseases of the circulatory system: Secondary | ICD-10-CM | POA: Insufficient documentation

## 2013-08-07 DIAGNOSIS — Z8669 Personal history of other diseases of the nervous system and sense organs: Secondary | ICD-10-CM | POA: Insufficient documentation

## 2013-08-07 DIAGNOSIS — F411 Generalized anxiety disorder: Secondary | ICD-10-CM | POA: Insufficient documentation

## 2013-08-07 DIAGNOSIS — F319 Bipolar disorder, unspecified: Secondary | ICD-10-CM | POA: Insufficient documentation

## 2013-08-07 DIAGNOSIS — Z791 Long term (current) use of non-steroidal anti-inflammatories (NSAID): Secondary | ICD-10-CM | POA: Insufficient documentation

## 2013-08-07 MED ORDER — DOXYCYCLINE HYCLATE 100 MG PO CAPS: 100.0000 mg | ORAL_CAPSULE | Freq: Two times a day (BID) | ORAL | Status: DC

## 2013-08-07 MED ORDER — HYDROCODONE-ACETAMINOPHEN 5-325 MG PO TABS
1.0000 | ORAL_TABLET | Freq: Once | ORAL | Status: AC
  Administered 2013-08-07: 1 via ORAL
  Filled 2013-08-07: qty 1

## 2013-08-07 MED ORDER — HYDROCODONE-ACETAMINOPHEN 5-325 MG PO TABS: 1.0000 | ORAL_TABLET | ORAL | Status: DC | PRN

## 2013-08-07 NOTE — ED Notes (Signed)
Pt amb to triage with quick steady gait in nad c/o "bump" to left thumb x 4 days, pt states "I think it's an abcess". Small <1cm red area noted to top of left thumb.

## 2013-08-07 NOTE — ED Provider Notes (Signed)
Medical screening examination/treatment/procedure(s) were performed by non-physician practitioner and as supervising physician I was immediately available for consultation/collaboration.  EKG Interpretation   None         Madysyn Hanken, MD 08/07/13 2159 

## 2013-08-07 NOTE — ED Provider Notes (Signed)
CSN: [id number]     Arrival date & time 08/07/13  1519 History   First MD Initiated Contact with Patient 08/07/13 1527     Chief Complaint  Patient presents with  . Hand Pain   (Consider location/radiation/quality/duration/timing/severity/associated sxs/prior Treatment) Patient is a 37 y.o. female presenting with hand pain. The history is provided by the patient. No language interpreter was used.  Hand Pain This is a new problem. The current episode started yesterday. Pertinent negatives include no fever or numbness. Associated symptoms comments: Swollen painful area to top of left thumb. She has had multiple abscesses in the past and states this feels the same. 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 No fever or swelling proximal to thumb. No drainage..    Past Medical History  Diagnosis Date  . Migraines   . Anxiety   . Depression   . Fibromyalgia   . Fibromyalgia   . Migraine   . Depression   . Seizures 2002    no reason-none since  . Complication of anesthesia     she bleed alot after c-sec-hard to clot-no other time-no work-up  . Bipolar disorder    Past Surgical History  Procedure Laterality Date  . Cesarean section    . Adenoidectomy    . Wisdom tooth extraction    . Eardrum repair    . Orif radial fracture  07/06/2011    Procedure: OPEN REDUCTION INTERNAL FIXATION (ORIF) RADIAL FRACTURE;  Surgeon: Tami Ribas;  Location: [facility];  Service: Orthopedics;  Laterality: Left;  ORIF LEFT DISTAL RADIUS   History reviewed. No pertinent family history. History  Substance Use Topics  . Smoking status: Never Smoker   . Smokeless tobacco: Never Used  . Alcohol Use: No   OB History   Grav Para Term Preterm Abortions TAB SAB Ect Mult Living                 Review of Systems  Constitutional: Negative for fever.  Musculoskeletal:       See HPI.  Skin: Positive for color change.  Neurological: Negative for numbness.     Allergies  Sulfa antibiotics  Home Medications   Current Outpatient Rx  Name  Route  Sig  Dispense  Refill  . adapalene (DIFFERIN) 0.1 % gel   Topical   Apply topically at bedtime.          Marland Kitchen EXPIRED: ARIPiprazole (ABILIFY) 5 MG tablet   Oral   Take 0.5 tablets (2.5 mg total) by mouth daily.   30 tablet   0   . ciprofloxacin (CIPRO) 500 MG tablet   Oral   Take 1 tablet (500 mg total) by mouth 2 (two) times daily.   20 tablet   0   . clindamycin (CLEOCIN) 300 MG capsule   Oral   Take 300 mg by mouth 3 (three) times daily.         . DULoxetine (CYMBALTA) 60 MG capsule   Oral   Take 2 capsules (120 mg total) by mouth every morning.   60 capsule   0   . HYDROcodone-acetaminophen (NORCO/VICODIN) 5-325 MG per tablet   Oral   Take 2 tablets by mouth every 4 (four) hours as needed for pain.   10 tablet   0   . ibuprofen (ADVIL,MOTRIN) 600 MG tablet   Oral   Take 1 tablet (600 mg total) by mouth every 6 (six) hours as needed for  pain.   30 tablet   0   . levonorgestrel (MIRENA) 20 MCG/24HR IUD   Intrauterine   1 each by Intrauterine route once.           . Multiple Vitamin (MULTIVITAMIN) tablet   Oral   Take 1 tablet by mouth daily.           . naproxen (NAPROSYN) 500 MG tablet   Oral   Take 1 tablet (500 mg total) by mouth 2 (two) times daily.   14 tablet   0   . omeprazole (PRILOSEC) 20 MG capsule   Oral   Take 1 capsule (20 mg total) by mouth daily.   20 capsule   0   . promethazine (PHENERGAN) 25 MG tablet   Oral   Take 1 tablet (25 mg total) by mouth every 6 (six) hours as needed for nausea.   10 tablet   0   . traMADol (ULTRAM) 50 MG tablet   Oral   Take 1 tablet (50 mg total) by mouth every 6 (six) hours as needed.   20 tablet   0   . traZODone (DESYREL) 100 MG tablet   Oral   Take 100 mg by mouth at bedtime.          BP 128/83  Pulse 85  Temp(Src) 99 F (37.2 C) (Oral)  Resp 18  Ht 5\' 1"  (1.549 m)  Wt 175 lb  (79.379 kg)  BMI 33.08 kg/m2  SpO2 96% Physical Exam  Constitutional: She is oriented to person, place, and time. She appears well-developed and well-nourished.  Neck: Normal range of motion.  Pulmonary/Chest: Effort normal.  Musculoskeletal:  Left thumb has a 1 cm, red tender swollen area without fluctuance c/w early abscess. FROM without pain. No flexor tenderness.   Neurological: She is alert and oriented to person, place, and time.  Skin: Skin is warm and dry.    ED Course  Procedures (including critical care time) Labs Review Labs Reviewed - No data to display Imaging Review No results found.  EKG Interpretation   None       MDM  No diagnosis found. 1. Early abscess left thumb  Aspiration attempted without purulent return. Will treat with abx and close follow up.    Arnoldo Hooker, PA-C 08/07/13 1726

## 2013-08-07 NOTE — ED Notes (Signed)
Patient given wound care supplies for home.

## 2013-09-24 ENCOUNTER — Other Ambulatory Visit: Payer: Self-pay | Admitting: Sports Medicine

## 2013-09-24 DIAGNOSIS — M545 Low back pain, unspecified: Secondary | ICD-10-CM

## 2013-09-24 DIAGNOSIS — M25559 Pain in unspecified hip: Secondary | ICD-10-CM

## 2013-10-07 ENCOUNTER — Other Ambulatory Visit: Payer: Self-pay

## 2013-10-12 ENCOUNTER — Other Ambulatory Visit: Payer: Self-pay

## 2014-08-09 ENCOUNTER — Emergency Department (HOSPITAL_BASED_OUTPATIENT_CLINIC_OR_DEPARTMENT_OTHER)
Admission: EM | Admit: 2014-08-09 | Discharge: 2014-08-09 | Disposition: A | Discharge status: Home / Self Care | Payer: Medicaid Other | Attending: Emergency Medicine | Admitting: Emergency Medicine

## 2014-08-09 ENCOUNTER — Encounter (HOSPITAL_BASED_OUTPATIENT_CLINIC_OR_DEPARTMENT_OTHER): Payer: Self-pay

## 2014-08-09 ENCOUNTER — Emergency Department (HOSPITAL_BASED_OUTPATIENT_CLINIC_OR_DEPARTMENT_OTHER): Payer: Medicaid Other

## 2014-08-09 DIAGNOSIS — Z792 Long term (current) use of antibiotics: Secondary | ICD-10-CM | POA: Diagnosis not present

## 2014-08-09 DIAGNOSIS — Y9289 Other specified places as the place of occurrence of the external cause: Secondary | ICD-10-CM | POA: Insufficient documentation

## 2014-08-09 DIAGNOSIS — E669 Obesity, unspecified: Secondary | ICD-10-CM | POA: Diagnosis not present

## 2014-08-09 DIAGNOSIS — Y990 Civilian activity done for income or pay: Secondary | ICD-10-CM | POA: Insufficient documentation

## 2014-08-09 DIAGNOSIS — Z8739 Personal history of other diseases of the musculoskeletal system and connective tissue: Secondary | ICD-10-CM | POA: Diagnosis not present

## 2014-08-09 DIAGNOSIS — X58XXXA Exposure to other specified factors, initial encounter: Secondary | ICD-10-CM | POA: Diagnosis not present

## 2014-08-09 DIAGNOSIS — Y9389 Activity, other specified: Secondary | ICD-10-CM | POA: Insufficient documentation

## 2014-08-09 DIAGNOSIS — Z8669 Personal history of other diseases of the nervous system and sense organs: Secondary | ICD-10-CM | POA: Insufficient documentation

## 2014-08-09 DIAGNOSIS — S8391XA Sprain of unspecified site of right knee, initial encounter: Secondary | ICD-10-CM | POA: Diagnosis not present

## 2014-08-09 DIAGNOSIS — S8991XA Unspecified injury of right lower leg, initial encounter: Secondary | ICD-10-CM | POA: Diagnosis present

## 2014-08-09 DIAGNOSIS — Z79899 Other long term (current) drug therapy: Secondary | ICD-10-CM | POA: Insufficient documentation

## 2014-08-09 DIAGNOSIS — Z8679 Personal history of other diseases of the circulatory system: Secondary | ICD-10-CM | POA: Insufficient documentation

## 2014-08-09 DIAGNOSIS — F329 Major depressive disorder, single episode, unspecified: Secondary | ICD-10-CM | POA: Diagnosis not present

## 2014-08-09 DIAGNOSIS — T1490XA Injury, unspecified, initial encounter: Secondary | ICD-10-CM

## 2014-08-09 MED ORDER — HYDROCODONE-ACETAMINOPHEN 5-325 MG PO TABS
1.0000 | ORAL_TABLET | Freq: Once | ORAL | Status: AC
  Administered 2014-08-09: 1 via ORAL
  Filled 2014-08-09: qty 1

## 2014-08-09 MED ORDER — IBUPROFEN 600 MG PO TABS: 600.0000 mg | ORAL_TABLET | Freq: Four times a day (QID) | ORAL | Status: DC | PRN

## 2014-08-09 NOTE — ED Provider Notes (Signed)
CSN: [id number]     Arrival date & time 08/09/14  1216 History   First MD Initiated Contact with Patient 08/09/14 1250     Chief Complaint  Patient presents with  . Knee Injury     (Consider location/radiation/quality/duration/timing/severity/associated sxs/prior Treatment) HPI   38 year old female with history of fibromyalgia, depression, anxiety, bipolar who presents for evaluation of right knee injury. Patient states this at work she has to stand for a prolonged period of time. 2 days ago she accidentally twisted her right knee while pivoting and experiencing an acute onset of sharp pain to the medial aspect of her right knee. She also reports that she felt a "pop" sensation and since then she has had persistent pain and swelling to the affected knee. She continues to bear weight on the knee but after standing for a prolonged time the pain got progressively worse. She has tried taking ibuprofen at home with minimal relief. States pain is nonradiating worsening with bending and prolonged standing. No associated hip or ankle pain, no rash or numbness. She denies any prior injury to the same knee.  Past Medical History  Diagnosis Date  . Migraines   . Anxiety   . Depression   . Fibromyalgia   . Fibromyalgia   . Migraine   . Depression   . Seizures 2002    no reason-none since  . Complication of anesthesia     she bleed alot after c-sec-hard to clot-no other time-no work-up  . Bipolar disorder    Past Surgical History  Procedure Laterality Date  . Cesarean section    . Adenoidectomy    . Wisdom tooth extraction    . Eardrum repair    . Orif radial fracture  07/06/2011    Procedure: OPEN REDUCTION INTERNAL FIXATION (ORIF) RADIAL FRACTURE;  Surgeon: Tami RibasKevin R Kuzma;  Location: [facility];  Service: Orthopedics;  Laterality: Left;  ORIF LEFT DISTAL RADIUS   No family history on file. History  Substance Use Topics  . Smoking status: Never Smoker   . Smokeless  tobacco: Never Used  . Alcohol Use: No   OB History    No data available     Review of Systems  Constitutional: Negative for fever.  Musculoskeletal: Positive for arthralgias.  Skin: Negative for rash and wound.  Neurological: Negative for numbness.      Allergies  Sulfa antibiotics  Home Medications   Prior to Admission medications   Medication Sig Start Date End Date Taking? Authorizing Provider  adapalene (DIFFERIN) 0.1 % gel Apply topically at bedtime.     Historical Provider, MD  ARIPiprazole (ABILIFY) 5 MG tablet Take 0.5 tablets (2.5 mg total) by mouth daily. 06/05/12 07/05/12  Jorje GuildAlan Watt, PA-C  clindamycin (CLEOCIN) 300 MG capsule Take 300 mg by mouth 3 (three) times daily.    Historical Provider, MD  DULoxetine (CYMBALTA) 60 MG capsule Take 2 capsules (120 mg total) by mouth every morning. 06/05/12   Jorje GuildAlan Watt, PA-C  ibuprofen (ADVIL,MOTRIN) 600 MG tablet Take 1 tablet (600 mg total) by mouth every 6 (six) hours as needed for pain. 03/26/13   Derwood KaplanAnkit Nanavati, MD  levonorgestrel (MIRENA) 20 MCG/24HR IUD 1 each by Intrauterine route once.      Historical Provider, MD  Multiple Vitamin (MULTIVITAMIN) tablet Take 1 tablet by mouth daily.      Historical Provider, MD  traZODone (DESYREL) 100 MG tablet Take 100 mg by mouth at bedtime.    Historical Provider, MD  BP 116/76 mmHg  Pulse 90  Temp(Src) 98.5 F (36.9 C) (Oral)  Resp 20  SpO2 97% Physical Exam  Constitutional: She appears well-developed and well-nourished. No distress.  Moderately obese Caucasian female appears to be in no acute distress.  HENT:  Head: Atraumatic.  Eyes: Conjunctivae are normal.  Neck: Neck supple.  Musculoskeletal: She exhibits tenderness (Right knee: Point tenderness to medial joint line on palpation with mild effusion. Normal knee flexion extension. No joint laxity noted, no gross deformity.).  Normal right hip and right ankle on exam. Pedal pulse palpable.  Neurological: She is alert.   Skin: No rash noted.  Psychiatric: She has a normal mood and affect.  Nursing note and vitals reviewed.   ED Course  Procedures (including critical care time)  Patient complaining of right knee pain to the medial joint line, suspect knee sprain. X-ray ordered, pain medication given. She is neurovascularly intact.  Labs Review Labs Reviewed - No data to display  Imaging Review Dg Knee Complete 4 Views Right  08/09/2014   CLINICAL DATA:  Pain medially following twisting injury  EXAM: RIGHT KNEE - COMPLETE 4+ VIEW  COMPARISON:  None.  FINDINGS: Frontal, lateral, and bilateral oblique views were obtained. There is no apparent fracture or dislocation. There is a joint effusion. There is minimal joint space narrowing medially with small spurs medially.  IMPRESSION: Slight osteoarthritic change medially. Joint effusion present. No fracture or dislocation.   Electronically Signed   By: Bretta BangWilliam  Woodruff M.D.   On: 08/09/2014 13:19     EKG Interpretation None      MDM   Final diagnoses:  Right knee sprain, initial encounter    BP 116/76 mmHg  Pulse 90  Temp(Src) 98.5 F (36.9 C) (Oral)  Resp 20  SpO2 97%  I have reviewed nursing notes and vital signs. I personally reviewed the imaging tests through PACS system  I reviewed available ER/hospitalization records thought the EMR     Fayrene HelperBowie Ambera Fedele, PA-C 08/09/14 1345  Hilario Quarryanielle S Ray, MD 08/09/14 [phone]

## 2014-08-09 NOTE — ED Notes (Signed)
Patient reports that she twisted right knee at work a few days ago, patient complains of ongoing discomfort

## 2014-08-09 NOTE — Discharge Instructions (Signed)

## 2014-09-21 ENCOUNTER — Encounter (HOSPITAL_BASED_OUTPATIENT_CLINIC_OR_DEPARTMENT_OTHER): Payer: Self-pay | Admitting: *Deleted

## 2014-09-21 ENCOUNTER — Emergency Department (HOSPITAL_BASED_OUTPATIENT_CLINIC_OR_DEPARTMENT_OTHER): Payer: Medicaid Other

## 2014-09-21 ENCOUNTER — Emergency Department (HOSPITAL_BASED_OUTPATIENT_CLINIC_OR_DEPARTMENT_OTHER)
Admission: EM | Admit: 2014-09-21 | Discharge: 2014-09-21 | Disposition: A | Discharge status: Home / Self Care | Payer: Medicaid Other | Attending: Emergency Medicine | Admitting: Emergency Medicine

## 2014-09-21 DIAGNOSIS — M797 Fibromyalgia: Secondary | ICD-10-CM | POA: Insufficient documentation

## 2014-09-21 DIAGNOSIS — F419 Anxiety disorder, unspecified: Secondary | ICD-10-CM | POA: Insufficient documentation

## 2014-09-21 DIAGNOSIS — Z8679 Personal history of other diseases of the circulatory system: Secondary | ICD-10-CM | POA: Insufficient documentation

## 2014-09-21 DIAGNOSIS — Z792 Long term (current) use of antibiotics: Secondary | ICD-10-CM | POA: Diagnosis not present

## 2014-09-21 DIAGNOSIS — R0789 Other chest pain: Secondary | ICD-10-CM | POA: Insufficient documentation

## 2014-09-21 DIAGNOSIS — J069 Acute upper respiratory infection, unspecified: Secondary | ICD-10-CM | POA: Diagnosis not present

## 2014-09-21 DIAGNOSIS — R062 Wheezing: Secondary | ICD-10-CM

## 2014-09-21 DIAGNOSIS — F319 Bipolar disorder, unspecified: Secondary | ICD-10-CM | POA: Insufficient documentation

## 2014-09-21 DIAGNOSIS — Z79899 Other long term (current) drug therapy: Secondary | ICD-10-CM | POA: Insufficient documentation

## 2014-09-21 DIAGNOSIS — R0602 Shortness of breath: Secondary | ICD-10-CM

## 2014-09-21 MED ORDER — ALBUTEROL SULFATE (2.5 MG/3ML) 0.083% IN NEBU: 5.0000 mg | INHALATION_SOLUTION | Freq: Once | RESPIRATORY_TRACT | Status: DC

## 2014-09-21 MED ORDER — ALBUTEROL SULFATE (2.5 MG/3ML) 0.083% IN NEBU
2.5000 mg | INHALATION_SOLUTION | Freq: Once | RESPIRATORY_TRACT | Status: AC
  Administered 2014-09-21: 2.5 mg via RESPIRATORY_TRACT
  Filled 2014-09-21: qty 3

## 2014-09-21 MED ORDER — HYDROCODONE-ACETAMINOPHEN 5-325 MG PO TABS: 1.0000 | ORAL_TABLET | Freq: Four times a day (QID) | ORAL | Status: DC | PRN

## 2014-09-21 MED ORDER — OXYCODONE-ACETAMINOPHEN 5-325 MG PO TABS
1.0000 | ORAL_TABLET | Freq: Once | ORAL | Status: AC
  Administered 2014-09-21: 1 via ORAL
  Filled 2014-09-21: qty 1

## 2014-09-21 MED ORDER — PREDNISONE 20 MG PO TABS: ORAL_TABLET | ORAL | Status: DC

## 2014-09-21 MED ORDER — ALBUTEROL SULFATE HFA 108 (90 BASE) MCG/ACT IN AERS: 1.0000 | INHALATION_SPRAY | Freq: Four times a day (QID) | RESPIRATORY_TRACT | Status: DC | PRN

## 2014-09-21 MED ORDER — PREDNISONE 50 MG PO TABS
60.0000 mg | ORAL_TABLET | Freq: Once | ORAL | Status: AC
  Administered 2014-09-21: 60 mg via ORAL
  Filled 2014-09-21 (×2): qty 1

## 2014-09-21 MED ORDER — IPRATROPIUM-ALBUTEROL 0.5-2.5 (3) MG/3ML IN SOLN
3.0000 mL | Freq: Once | RESPIRATORY_TRACT | Status: AC
  Administered 2014-09-21: 3 mL via RESPIRATORY_TRACT
  Filled 2014-09-21: qty 3

## 2014-09-21 NOTE — Discharge Instructions (Signed)
Cough, Adult   A cough is a reflex. It helps you clear your throat and airways. A cough can help heal your body. A cough can last 2 or 3 weeks (acute) or may last more than 8 weeks (chronic). Some common causes of a cough can include an infection, allergy, or a cold.  HOME CARE  · Only take medicine as told by your doctor.  · If given, take your medicines (antibiotics) as told. Finish them even if you start to feel better.  · Use a cold steam vaporizer or humidifier in your home. This can help loosen thick spit (secretions).  · Sleep so you are almost sitting up (semi-upright). Use pillows to do this. This helps reduce coughing.  · Rest as needed.  · Stop smoking if you smoke.  GET HELP RIGHT AWAY IF:  · You have yellowish-white fluid (pus) in your thick spit.  · Your cough gets worse.  · Your medicine does not reduce coughing, and you are losing sleep.  · You cough up blood.  · You have trouble breathing.  · Your pain gets worse and medicine does not help.  · You have a fever.  MAKE SURE YOU:   · Understand these instructions.  · Will watch your condition.  · Will get help right away if you are not doing well or get worse.  Document Released: 04/20/2011 Document Revised: 12/22/2013 Document Reviewed: 04/20/2011  ExitCare® Patient Information ©2015 [employer]. This information is not intended to replace advice given to you by your health care provider. Make sure you discuss any questions you have with your health care provider.

## 2014-09-21 NOTE — ED Notes (Signed)
Patient states she has a three day history of a productive cough which is associated with sore throat and fever up to 103

## 2014-09-21 NOTE — ED Provider Notes (Signed)
CSN: [id number]     Arrival date & time 09/21/14  [id number] History   First MD Initiated Contact with Patient 09/21/14 [phone]     Chief Complaint  Patient presents with  . Shortness of Breath     (Consider location/radiation/quality/duration/timing/severity/associated sxs/prior Treatment) Patient is a 39 y.o. female presenting with shortness of breath. The history is provided by the patient.  Shortness of Breath Severity:  Mild Onset quality:  Gradual Timing:  Constant Progression:  Worsening Chronicity:  New Context: URI   Relieved by:  Nothing Worsened by:  Nothing tried Ineffective treatments:  None tried Associated symptoms: chest pain (chest wall pain) and cough   Associated symptoms: no abdominal pain, no fever, no headaches, no neck pain and no vomiting   Chest pain:    Chest pain quality: tightness and aching worse w/ cough.   Severity:  Mild   Onset quality:  Gradual   Timing:  Constant   Progression:  Unchanged   Chronicity:  New Cough:    Cough characteristics:  Productive   Sputum characteristics:  White   Severity:  Mild   Onset quality:  Gradual   Timing:  Constant   Progression:  Improving   Chronicity:  New   Past Medical History  Diagnosis Date  . Migraines   . Anxiety   . Depression   . Fibromyalgia   . Fibromyalgia   . Migraine   . Depression   . Seizures 2002    no reason-none since  . Complication of anesthesia     she bleed alot after c-sec-hard to clot-no other time-no work-up  . Bipolar disorder    Past Surgical History  Procedure Laterality Date  . Cesarean section    . Adenoidectomy    . Wisdom tooth extraction    . Eardrum repair    . Orif radial fracture  07/06/2011    Procedure: OPEN REDUCTION INTERNAL FIXATION (ORIF) RADIAL FRACTURE;  Surgeon: Tami RibasKevin R Kuzma;  Location: [facility];  Service: Orthopedics;  Laterality: Left;  ORIF LEFT DISTAL RADIUS   No family history on file. History  Substance Use Topics  .  Smoking status: Never Smoker   . Smokeless tobacco: Never Used  . Alcohol Use: No   OB History    No data available     Review of Systems  Constitutional: Negative for fever and fatigue.  HENT: Negative for congestion and drooling.   Eyes: Negative for pain.  Respiratory: Positive for cough, chest tightness and shortness of breath.   Cardiovascular: Positive for chest pain (chest wall pain).  Gastrointestinal: Negative for nausea, vomiting, abdominal pain and diarrhea.  Genitourinary: Negative for dysuria and hematuria.  Musculoskeletal: Negative for back pain, gait problem and neck pain.  Skin: Negative for color change.  Neurological: Negative for dizziness and headaches.  Hematological: Negative for adenopathy.  Psychiatric/Behavioral: Negative for behavioral problems.  All other systems reviewed and are negative.     Allergies  Sulfa antibiotics  Home Medications   Prior to Admission medications   Medication Sig Start Date End Date Taking? Authorizing Provider  adapalene (DIFFERIN) 0.1 % gel Apply topically at bedtime.     Historical Provider, MD  ARIPiprazole (ABILIFY) 5 MG tablet Take 0.5 tablets (2.5 mg total) by mouth daily. 06/05/12 07/05/12  Jorje GuildAlan Watt, PA-C  clindamycin (CLEOCIN) 300 MG capsule Take 300 mg by mouth 3 (three) times daily.    Historical Provider, MD  DULoxetine (CYMBALTA) 60 MG capsule Take 2 capsules (120  mg total) by mouth every morning. 06/05/12   Jorje Guild, PA-C  ibuprofen (ADVIL,MOTRIN) 600 MG tablet Take 1 tablet (600 mg total) by mouth every 6 (six) hours as needed. 08/09/14   Fayrene Helper, PA-C  levonorgestrel (MIRENA) 20 MCG/24HR IUD 1 each by Intrauterine route once.      Historical Provider, MD  Multiple Vitamin (MULTIVITAMIN) tablet Take 1 tablet by mouth daily.      Historical Provider, MD  traZODone (DESYREL) 100 MG tablet Take 100 mg by mouth at bedtime.    Historical Provider, MD   BP 142/76 mmHg  Pulse 104  Temp(Src) 99.7 F (37.6  C) (Oral)  Resp 18  Ht  (1.549 m)  Wt 205 lb (92.987 kg)  BMI 38.75 kg/m2  SpO2 93% Physical Exam  Constitutional: She is oriented to person, place, and time. She appears well-developed and well-nourished.  HENT:  Head: Normocephalic.  Mouth/Throat: Oropharynx is clear and moist. No oropharyngeal exudate.  Eyes: Conjunctivae and EOM are normal. Pupils are equal, round, and reactive to light.  Neck: Normal range of motion. Neck supple.  Cardiovascular: Normal rate, regular rhythm, normal heart sounds and intact distal pulses.  Exam reveals no gallop and no friction rub.   No murmur heard. Pulmonary/Chest: Effort normal. No respiratory distress. She has wheezes (diffuse mild wheezing.).  Abdominal: Soft. Bowel sounds are normal. There is no tenderness. There is no rebound and no guarding.  Musculoskeletal: Normal range of motion. She exhibits no edema or tenderness.  Neurological: She is alert and oriented to person, place, and time.  Skin: Skin is warm and dry.  Psychiatric: She has a normal mood and affect. Her behavior is normal.  Nursing note and vitals reviewed.   ED Course  Procedures (including critical care time) Labs Review Labs Reviewed - No data to display  Imaging Review Dg Chest 2 View  09/21/2014   CLINICAL DATA:  Cough, congestion and shortness of breath for 3 days  EXAM: CHEST  2 VIEW  COMPARISON:  07/03/2013  FINDINGS: Normal heart size, mediastinal contours, and pulmonary vascularity.  New peribronchial thickening.  No pulmonary infiltrate, pleural effusion, or pneumothorax.  Bones unremarkable.  IMPRESSION: Bronchitic changes without acute infiltrate.   Electronically Signed   By: Ulyses Southward M.D.   On: 09/21/2014 09:44     EKG Interpretation   Date/Time:  Monday September 21 2014 09:23:38 EST Ventricular Rate:  110 PR Interval:  128 QRS Duration: 76 QT Interval:  402 QTC Calculation: 544 R Axis:   60 Text Interpretation:  Sinus tachycardia Prolonged  QT Confirmed by Laylee Schooley   MD, Thaddeus Evitts ([id number]) on 09/21/2014 9:25:56 AM      MDM   Final diagnoses:  SOB (shortness of breath)  URI (upper respiratory infection)  Wheezing    9:01 AM 39 y.o. female who presents with complaint of cough, chest tightness, shortness of breath. She notes she has chest pain with coughing and feels a tightness in her chest. She states that she has had wheezing in the past with URIs. She does have diffuse wheezing on my exam today. She denies any fevers and is afebrile here. She is mildly tachycardic on initial vital signs otherwise they're unremarkable. Likely viral URI with wheezing. Chest tightness likely related to reactive airway disease. We'll get screening EKG.  10:12 AM: Patient improved significantly after breathing treatment. Chest x-ray with bronchitic changes. Will start the patient on prednisone and also provide some pain medicine for her chest wall pain. I  have discussed the diagnosis/risks/treatment options with the patient and believe the pt to be eligible for discharge home to follow-up with her pcp as needed. We also discussed returning to the ED immediately if new or worsening sx occur. We discussed the sx which are most concerning (e.g., fever, worsening sob, worsening cp) that necessitate immediate return. Medications administered to the patient during their visit and any new prescriptions provided to the patient are listed below.  Medications given during this visit Medications  predniSONE (DELTASONE) tablet 60 mg (not administered)  ipratropium-albuterol (DUONEB) 0.5-2.5 (3) MG/3ML nebulizer solution 3 mL (3 mLs Nebulization Given 09/21/14 0905)  oxyCODONE-acetaminophen (PERCOCET/ROXICET) 5-325 MG per tablet 1 tablet (1 tablet Oral Given 09/21/14 0912)  albuterol (PROVENTIL) (2.5 MG/3ML) 0.083% nebulizer solution 2.5 mg (2.5 mg Nebulization Given 09/21/14 0912)    New Prescriptions   ALBUTEROL (PROVENTIL HFA;VENTOLIN HFA) 108 (90 BASE) MCG/ACT INHALER     Inhale 1-2 puffs into the lungs every 6 (six) hours as needed for wheezing or shortness of breath.   HYDROCODONE-ACETAMINOPHEN (NORCO) 5-325 MG PER TABLET    Take 1 tablet by mouth every 6 (six) hours as needed for moderate pain.   PREDNISONE (DELTASONE) 20 MG TABLET    Take 2 tablets on day 1 and 2. Take 1 tablet on day 3.     Purvis Sheffield, MD 09/21/14 1013

## 2016-03-03 ENCOUNTER — Encounter (HOSPITAL_COMMUNITY): Payer: Self-pay | Admitting: *Deleted

## 2016-03-03 ENCOUNTER — Emergency Department (HOSPITAL_COMMUNITY)
Admission: EM | Admit: 2016-03-03 | Discharge: 2016-03-03 | Disposition: A | Discharge status: Home / Self Care | Payer: Medicaid Other | Attending: Emergency Medicine | Admitting: Emergency Medicine

## 2016-03-03 DIAGNOSIS — M25561 Pain in right knee: Secondary | ICD-10-CM

## 2016-03-03 MED ORDER — TETANUS-DIPHTH-ACELL PERTUSSIS 5-2.5-18.5 LF-MCG/0.5 IM SUSP
0.5000 mL | Freq: Once | INTRAMUSCULAR | Status: AC
  Administered 2016-03-03: 0.5 mL via INTRAMUSCULAR
  Filled 2016-03-03: qty 0.5

## 2016-03-03 NOTE — ED Provider Notes (Signed)
CSN: [id number]     Arrival date & time 03/03/16  1613 History  By signing my name below, I, Bridgette HabermannMaria Tan, attest that this documentation has been prepared under the direction and in the presence of General MillsBenjamin Sayde Lish, PA-C. Electronically Signed: Bridgette HabermannMaria Tan, ED Scribe. 03/03/2016. 4:59 PM.   No chief complaint on file.   The history is provided by the patient. No language interpreter was used.   HPI Comments: Shelly Cummings is a 40 y.o. female with h/o fibromyalgia who presents to the Emergency Department complaining of gradual onset, constant right inner knee pain radiating up her leg to her hip since 02/21/16. Pt notes she was carrying a lot of groceries on 02/21/16 and states that could be why the pain is present. She is ambulatory. Pt also has associated weakness secondary to pain. Pt reports that pain is exacerbated with movement and palpation. Pt notes she has a brace at home that is too small but thinks that it would help if she had one. Pt has an orthopedic doctor she follows up with but notes that he is unavailable so she went to the ED. Pt is also requesting a referral. Pt denies h/o of injury to the right knee. Pt denies numbness and paresthesia.   Past Medical History  Diagnosis Date  . Migraines   . Anxiety   . Depression   . Fibromyalgia   . Fibromyalgia   . Migraine   . Depression   . Seizures (HCC) 2002    no reason-none since  . Complication of anesthesia     she bleed alot after c-sec-hard to clot-no other time-no work-up  . Bipolar disorder [facility](HCC)    Past Surgical History  Procedure Laterality Date  . Cesarean section    . Adenoidectomy    . Wisdom tooth extraction    . Eardrum repair    . Orif radial fracture  07/06/2011    Procedure: OPEN REDUCTION INTERNAL FIXATION (ORIF) RADIAL FRACTURE;  Surgeon: Tami RibasKevin R Kuzma;  Location: [facility];  Service: Orthopedics;  Laterality: Left;  ORIF LEFT DISTAL RADIUS   No family history on file. Social History   Substance Use Topics  . Smoking status: Never Smoker   . Smokeless tobacco: Never Used  . Alcohol Use: No   OB History    No data available     Review of Systems A complete 10 system review of systems was obtained and all systems are negative except as noted in the HPI and PMH.    Allergies  Sulfa antibiotics  Home Medications   Prior to Admission medications   Medication Sig Start Date End Date Taking? Authorizing Provider  adapalene (DIFFERIN) 0.1 % gel Apply topically at bedtime.     Historical Provider, MD  albuterol (PROVENTIL HFA;VENTOLIN HFA) 108 (90 BASE) MCG/ACT inhaler Inhale 1-2 puffs into the lungs every 6 (six) hours as needed for wheezing or shortness of breath. 09/21/14   Purvis SheffieldForrest Harrison, MD  ARIPiprazole (ABILIFY) 5 MG tablet Take 0.5 tablets (2.5 mg total) by mouth daily. 06/05/12 07/05/12  Yolande JollyAlan H Watt, PA-C  clindamycin (CLEOCIN) 300 MG capsule Take 300 mg by mouth 3 (three) times daily.    Historical Provider, MD  DULoxetine (CYMBALTA) 60 MG capsule Take 2 capsules (120 mg total) by mouth every morning. 06/05/12   Yolande JollyAlan H Watt, PA-C  HYDROcodone-acetaminophen (NORCO) 5-325 MG per tablet Take 1 tablet by mouth every 6 (six) hours as needed for moderate pain. 09/21/14   Forrest  Romeo Apple, MD  ibuprofen (ADVIL,MOTRIN) 600 MG tablet Take 1 tablet (600 mg total) by mouth every 6 (six) hours as needed. 08/09/14   Fayrene Helper, PA-C  levonorgestrel (MIRENA) 20 MCG/24HR IUD 1 each by Intrauterine route once.      Historical Provider, MD  Multiple Vitamin (MULTIVITAMIN) tablet Take 1 tablet by mouth daily.      Historical Provider, MD  predniSONE (DELTASONE) 20 MG tablet Take 2 tablets on day 1 and 2. Take 1 tablet on day 3. 09/22/14   Purvis Sheffield, MD  traZODone (DESYREL) 100 MG tablet Take 100 mg by mouth at bedtime.    Historical Provider, MD   BP 110/84 mmHg  Pulse 95  Temp(Src) 98.3 F (36.8 C) (Oral)  Resp 20  Ht  (1.549 m)  Wt 101.719 kg  BMI 42.39 kg/m2   SpO2 98% Physical Exam  Constitutional: She appears well-developed and well-nourished.  Morbidly obese Caucasian female  HENT:  Head: Normocephalic.  Eyes: Conjunctivae are normal.  Cardiovascular: Normal rate.   Pulmonary/Chest: Effort normal. No respiratory distress.  Abdominal: She exhibits no distension.  Musculoskeletal: Normal range of motion.  Mild tenderness to medial aspect of right knee joint line. No ligamentous laxity. No erythema, effusion, overt edema. Maintains full active range of motion, no crepitus. No heat, warmth. Distal pulses intact with brisk cap refill.  Neurological: She is alert.  Motor strength and sensation are baseline. Gait is baseline without ataxia.  Skin: Skin is warm and dry.  Psychiatric: She has a normal mood and affect. Her behavior is normal.  Nursing note and vitals reviewed.   ED Course  Procedures  DIAGNOSTIC STUDIES: Oxygen Saturation is 98% on RA, normal by my interpretation.    COORDINATION OF CARE: 4:59 PM Discussed treatment plan with pt at bedside and pt agreed to plan.  Labs Review Labs Reviewed - No data to display  Imaging Review No results found. I have personally reviewed and evaluated these images and lab results as part of my medical decision-making.   EKG Interpretation None      MDM  Patient with gradual onset, mild right medial joint pain. No evidence of cellulitis, septic arthritis, hemarthrosis or other emergent pathology. Gait baseline. Discussed Aleve and Tylenol therapy. Pt requests referral to [facility] for reevaluation. Patient given Ace wrap while in ED, conservative therapy recommended and discussed. Additionally, patient requests Tdap Update. Patient will be dc home & is agreeable with above plan.  Final diagnoses:  Right medial knee pain    I personally performed the services described in this documentation, which was scribed in my presence. The recorded information has been reviewed and is  accurate.   MDM Number of Diagnoses or Management Options   Joycie Peek, PA-C 03/03/16 1729  Joycie Peek, PA-C 03/03/16 1732  Bethann Berkshire, MD 03/03/16 2303

## 2016-03-03 NOTE — Discharge Instructions (Signed)
History follow-up with your orthopedist/Murphy Thurston HoleWainer for reevaluation as needed. You may take Aleve and Tylenol as we discussed. Do not exceed more than 1000 mg of Aleve in a day, 3500 mg of Tylenol in one day. Return to ED for any new or worsening symptoms as we discussed.  Joint Pain Joint pain, which is also called arthralgia, can be caused by many things. Joint pain often goes away when you follow your health care provider's instructions for relieving pain at home. However, joint pain can also be caused by conditions that require further treatment. Common causes of joint pain include:  Bruising in the area of the joint.  Overuse of the joint.  Wear and tear on the joints that occur with aging (osteoarthritis).  Various other forms of arthritis.  A buildup of a crystal form of uric acid in the joint (gout).  Infections of the joint (septic arthritis) or of the bone (osteomyelitis). Your health care provider may recommend medicine to help with the pain. If your joint pain continues, additional tests may be needed to diagnose your condition. HOME CARE INSTRUCTIONS Watch your condition for any changes. Follow these instructions as directed to lessen the pain that you are feeling.  Take medicines only as directed by your health care provider.  Rest the affected area for as long as your health care provider says that you should. If directed to do so, raise the painful joint above the level of your heart while you are sitting or lying down.  Do not do things that cause or worsen pain.  If directed, apply ice to the painful area:  Put ice in a plastic bag.  Place a towel between your skin and the bag.  Leave the ice on for 20 minutes, 2-3 times per day.  Wear an elastic bandage, splint, or sling as directed by your health care provider. Loosen the elastic bandage or splint if your fingers or toes become numb and tingle, or if they turn cold and blue.  Begin exercising or stretching  the affected area as directed by your health care provider. Ask your health care provider what types of exercise are safe for you.  Keep all follow-up visits as directed by your health care provider. This is important. SEEK MEDICAL CARE IF:  Your pain increases, and medicine does not help.  Your joint pain does not improve within 3 days.  You have increased bruising or swelling.  You have a fever.  You lose 10 lb (4.5 kg) or more without trying. SEEK IMMEDIATE MEDICAL CARE IF:  You are not able to move the joint.  Your fingers or toes become numb or they turn cold and blue.   This information is not intended to replace advice given to you by your health care provider. Make sure you discuss any questions you have with your health care provider.   Document Released: 08/07/2005 Document Revised: 08/28/2014 Document Reviewed: 05/19/2014 Elsevier Interactive Patient Education [employer]2016 Elsevier Inc.

## 2016-03-03 NOTE — ED Notes (Signed)
Pt c/o R inner knee pain since 02/21/16, pt denies trauma or injury, pt reports having fibromyalgia & was carrying a lot of groceries & walking & her knee has been hurting since then, pt ambulatory, pt MAE, A&O x4

## 2016-06-23 ENCOUNTER — Encounter (HOSPITAL_BASED_OUTPATIENT_CLINIC_OR_DEPARTMENT_OTHER): Payer: Self-pay | Admitting: *Deleted

## 2016-06-23 ENCOUNTER — Emergency Department (HOSPITAL_BASED_OUTPATIENT_CLINIC_OR_DEPARTMENT_OTHER)
Admission: EM | Admit: 2016-06-23 | Discharge: 2016-06-23 | Disposition: A | Discharge status: Home / Self Care | Payer: Medicaid Other | Attending: Emergency Medicine | Admitting: Emergency Medicine

## 2016-06-23 ENCOUNTER — Emergency Department (HOSPITAL_BASED_OUTPATIENT_CLINIC_OR_DEPARTMENT_OTHER): Payer: Medicaid Other

## 2016-06-23 DIAGNOSIS — Z791 Long term (current) use of non-steroidal anti-inflammatories (NSAID): Secondary | ICD-10-CM | POA: Insufficient documentation

## 2016-06-23 DIAGNOSIS — J069 Acute upper respiratory infection, unspecified: Secondary | ICD-10-CM

## 2016-06-23 DIAGNOSIS — R05 Cough: Secondary | ICD-10-CM | POA: Diagnosis present

## 2016-06-23 DIAGNOSIS — Z79899 Other long term (current) drug therapy: Secondary | ICD-10-CM | POA: Insufficient documentation

## 2016-06-23 DIAGNOSIS — B9789 Other viral agents as the cause of diseases classified elsewhere: Secondary | ICD-10-CM

## 2016-06-23 MED ORDER — IPRATROPIUM-ALBUTEROL 0.5-2.5 (3) MG/3ML IN SOLN
RESPIRATORY_TRACT | Status: AC
  Administered 2016-06-23: 3 mL
  Filled 2016-06-23: qty 3

## 2016-06-23 MED ORDER — ALBUTEROL SULFATE HFA 108 (90 BASE) MCG/ACT IN AERS
2.0000 | INHALATION_SPRAY | RESPIRATORY_TRACT | Status: DC | PRN
  Administered 2016-06-23: 2 via RESPIRATORY_TRACT
  Filled 2016-06-23: qty 6.7

## 2016-06-23 MED ORDER — AZITHROMYCIN 250 MG PO TABS: ORAL_TABLET | ORAL | 0 refills | Status: DC

## 2016-06-23 MED ORDER — ALBUTEROL SULFATE (2.5 MG/3ML) 0.083% IN NEBU
INHALATION_SOLUTION | RESPIRATORY_TRACT | Status: AC
  Administered 2016-06-23: 2.5 mg
  Filled 2016-06-23: qty 3

## 2016-06-23 MED ORDER — BENZONATATE 100 MG PO CAPS: 100.0000 mg | ORAL_CAPSULE | Freq: Three times a day (TID) | ORAL | 0 refills | Status: DC | PRN

## 2016-06-23 MED ORDER — GUAIFENESIN 100 MG/5ML PO LIQD: 100.0000 mg | ORAL | 0 refills | Status: DC | PRN

## 2016-06-23 MED ORDER — HYDROCOD POLST-CPM POLST ER 10-8 MG/5ML PO SUER: 5.0000 mL | Freq: Two times a day (BID) | ORAL | 0 refills | Status: DC | PRN

## 2016-06-23 MED FILL — AZITHROMYCIN 250 MG TABLET: 250 | 5 days supply | Qty: 6 | Fill #0

## 2016-06-23 MED FILL — HYDROCODONE-CHLORPHENIRAM S: 10-8 | 14 days supply | Qty: 140 | Fill #0

## 2016-06-23 NOTE — ED Provider Notes (Signed)
[facility]   CSN: [id number] Arrival date & time: 06/23/16  1006     History   Chief Complaint Chief Complaint  Patient presents with  . Cough  . Fever    HPI Shelly Cummings is a 40 y.o. female.  HPI   40 year old female with history of fibromyalgia, depression, anxiety presenting for evaluation of a cough. Patient report for the past 2 weeks she has had a nonproductive cough, sinus congestion, wheezing, and fever as high as 102. Symptom has gotten progressively worse. She has been using over-the-counter medication including Mucinex, Delsym's, and Advil with minimal improvement. She does have history of exercise-induced asthma but ran out of her inhaler. Patient denies worsening shortness of breath, hemoptysis, ear pain, sneezing, abdominal pain or back pain, nausea vomiting diarrhea. She is a nonsmoker. Recent sick contact exposure. No recent hospitalization.  Past Medical History:  Diagnosis Date  . Anxiety   . Complication of anesthesia    she bleed alot after c-sec-hard to clot-no other time-no work-up  . Depression   . Depression   . Fibromyalgia   . Fibromyalgia   . Migraine   . Migraines   . Seizures (HCC) 2002   no reason-none since    There are no active problems to display for this patient.   Past Surgical History:  Procedure Laterality Date  . adenoidectomy    . CESAREAN SECTION    . eardrum repair    . ORIF RADIAL FRACTURE  07/06/2011   Procedure: OPEN REDUCTION INTERNAL FIXATION (ORIF) RADIAL FRACTURE;  Surgeon: Tami Ribas;  Location: [facility];  Service: Orthopedics;  Laterality: Left;  ORIF LEFT DISTAL RADIUS  . WISDOM TOOTH EXTRACTION      OB History    No data available       Home Medications    Prior to Admission medications   Medication Sig Start Date End Date Taking? Authorizing Provider  adapalene (DIFFERIN) 0.1 % gel Apply topically at bedtime.    Yes Historical Provider, MD  albuterol  (PROVENTIL HFA;VENTOLIN HFA) 108 (90 BASE) MCG/ACT inhaler Inhale 1-2 puffs into the lungs every 6 (six) hours as needed for wheezing or shortness of breath. 09/21/14  Yes Purvis Sheffield, MD  ALPRAZolam Prudy Feeler) 1 MG tablet Take 1 mg by mouth 3 (three) times daily as needed for anxiety.   Yes Historical Provider, MD  DULoxetine (CYMBALTA) 60 MG capsule Take 2 capsules (120 mg total) by mouth every morning. 06/05/12  Yes Yolande Jolly, PA-C  ibuprofen (ADVIL,MOTRIN) 600 MG tablet Take 1 tablet (600 mg total) by mouth every 6 (six) hours as needed. 08/09/14  Yes Fayrene Helper, PA-C  levonorgestrel (MIRENA) 20 MCG/24HR IUD 1 each by Intrauterine route once.     Yes Historical Provider, MD  Multiple Vitamin (MULTIVITAMIN) tablet Take 1 tablet by mouth daily.     Yes Historical Provider, MD  pregabalin (LYRICA) 150 MG capsule Take 150 mg by mouth 2 (two) times daily.   Yes Historical Provider, MD  traZODone (DESYREL) 100 MG tablet Take 100 mg by mouth at bedtime.   Yes Historical Provider, MD  ARIPiprazole (ABILIFY) 5 MG tablet Take 0.5 tablets (2.5 mg total) by mouth daily. 06/05/12 07/05/12  Yolande Jolly, PA-C  clindamycin (CLEOCIN) 300 MG capsule Take 300 mg by mouth 3 (three) times daily.    Historical Provider, MD  HYDROcodone-acetaminophen (NORCO) 5-325 MG per tablet Take 1 tablet by mouth every 6 (six) hours as  needed for moderate pain. 09/21/14   Purvis SheffieldForrest Harrison, MD  predniSONE (DELTASONE) 20 MG tablet Take 2 tablets on day 1 and 2. Take 1 tablet on day 3. 09/22/14   Purvis SheffieldForrest Harrison, MD    Family History No family history on file.  Social History Social History  Substance Use Topics  . Smoking status: Never Smoker  . Smokeless tobacco: Never Used  . Alcohol use No     Allergies   Sulfa antibiotics   Review of Systems Review of Systems  All other systems reviewed and are negative.    Physical Exam Updated Vital Signs BP 115/75 (BP Location: Right Arm)   Pulse 83   Temp 99.4 F (37.4  C) (Oral)   Resp 20   Ht 5\' 1"  (1.549 m)   Wt 79.4 kg   SpO2 95%   BMI 33.07 kg/m   Physical Exam  Constitutional: She appears well-developed and well-nourished. No distress.  HENT:  Head: Atraumatic.  Right Ear: External ear normal.  Left Ear: External ear normal.  Nose: Nose normal.  Mouth/Throat: Oropharynx is clear and moist.  Eyes: Conjunctivae are normal.  Neck: Neck supple.  Cardiovascular: Intact distal pulses.   Mild tachycardia without murmur rubs or gallops  Pulmonary/Chest: Effort normal and breath sounds normal. No respiratory distress. She has no wheezes. She has no rales. She exhibits no tenderness.  Abdominal: Soft. There is no tenderness.  Musculoskeletal: She exhibits no edema.  Neurological: She is alert.  Skin: No rash noted.  Psychiatric: She has a normal mood and affect.  Nursing note and vitals reviewed.    ED Treatments / Results  Labs (all labs ordered are listed, but only abnormal results are displayed) Labs Reviewed - No data to display  EKG  EKG Interpretation None       Radiology Dg Chest 2 View  Result Date: 06/23/2016 CLINICAL DATA:  Left-sided chest pain radiating to the back EXAM: CHEST  2 VIEW COMPARISON:  09/21/2014 FINDINGS: The heart size and mediastinal contours are within normal limits. Both lungs are clear. The visualized skeletal structures are unremarkable. IMPRESSION: No active cardiopulmonary disease. Electronically Signed   By: Elige KoHetal  Patel   On: 06/23/2016 10:51    Procedures Procedures (including critical care time)  Medications Ordered in ED Medications  albuterol (PROVENTIL) (2.5 MG/3ML) 0.083% nebulizer solution (2.5 mg  Given 06/23/16 1027)  ipratropium-albuterol (DUONEB) 0.5-2.5 (3) MG/3ML nebulizer solution (3 mLs  Given 06/23/16 1027)     Initial Impression / Assessment and Plan / ED Course  I have reviewed the triage vital signs and the nursing notes.  Pertinent labs & imaging results that were  available during my care of the patient were reviewed by me and considered in my medical decision making (see chart for details).  Clinical Course    BP 115/75 (BP Location: Right Arm)   Pulse 83   Temp 99.4 F (37.4 C) (Oral)   Resp 20   Ht 5\' 1"  (1.549 m)   Wt 79.4 kg   SpO2 95%   BMI 33.07 kg/m    Final Clinical Impressions(s) / ED Diagnoses   Final diagnoses:  Viral URI with cough    New Prescriptions New Prescriptions   BENZONATATE (TESSALON) 100 MG CAPSULE    Take 1 capsule (100 mg total) by mouth 3 (three) times daily as needed for cough.   GUAIFENESIN (ROBITUSSIN) 100 MG/5ML LIQUID    Take 5-10 mLs (100-200 mg total) by mouth every 4 (four) hours  as needed for cough.   10:52 AM Patient presents with persistent cough and fever as well as congestion. Will obtain chest x-ray to rule out pneumonia. No hypoxia and patient is afebrile currently.  She will also benefit from a rescue inhaler .  11:37 AM cxr neg for acute focal infiltrates to suggest pna. Likely viral URI.  Will provide sxs treatment.  Rescue inhaler given to use as needed.  Return precaution discussed.   11:44 AM Pt adamantly requesting abx.  Given that she has sxs x 2 weeks, and not getting better.  She also has fluid in both ears.  Will prescribe zpak.    Fayrene HelperBowie Camarion Weier, PA-C 06/23/16 1138    Fayrene HelperBowie Sharlena Kristensen, PA-C 06/23/16 1145    Heide Scaleshristopher J Tegeler, MD 06/23/16 [phone]

## 2016-06-23 NOTE — ED Notes (Signed)
ED Provider at bedside. 

## 2016-06-23 NOTE — ED Triage Notes (Signed)
Pt reports congested cough x 2 weeks. Fever x 1 week. States she has had a flu shot this year

## 2016-10-22 IMAGING — CR DG KNEE COMPLETE 4+V*R*
4 series · 4 of 4 positions shown · non-contrast
Comparison: None.

CLINICAL DATA: Pain medially following twisting injury

EXAM:
RIGHT KNEE - COMPLETE 4+ VIEW

[t knee ap right]
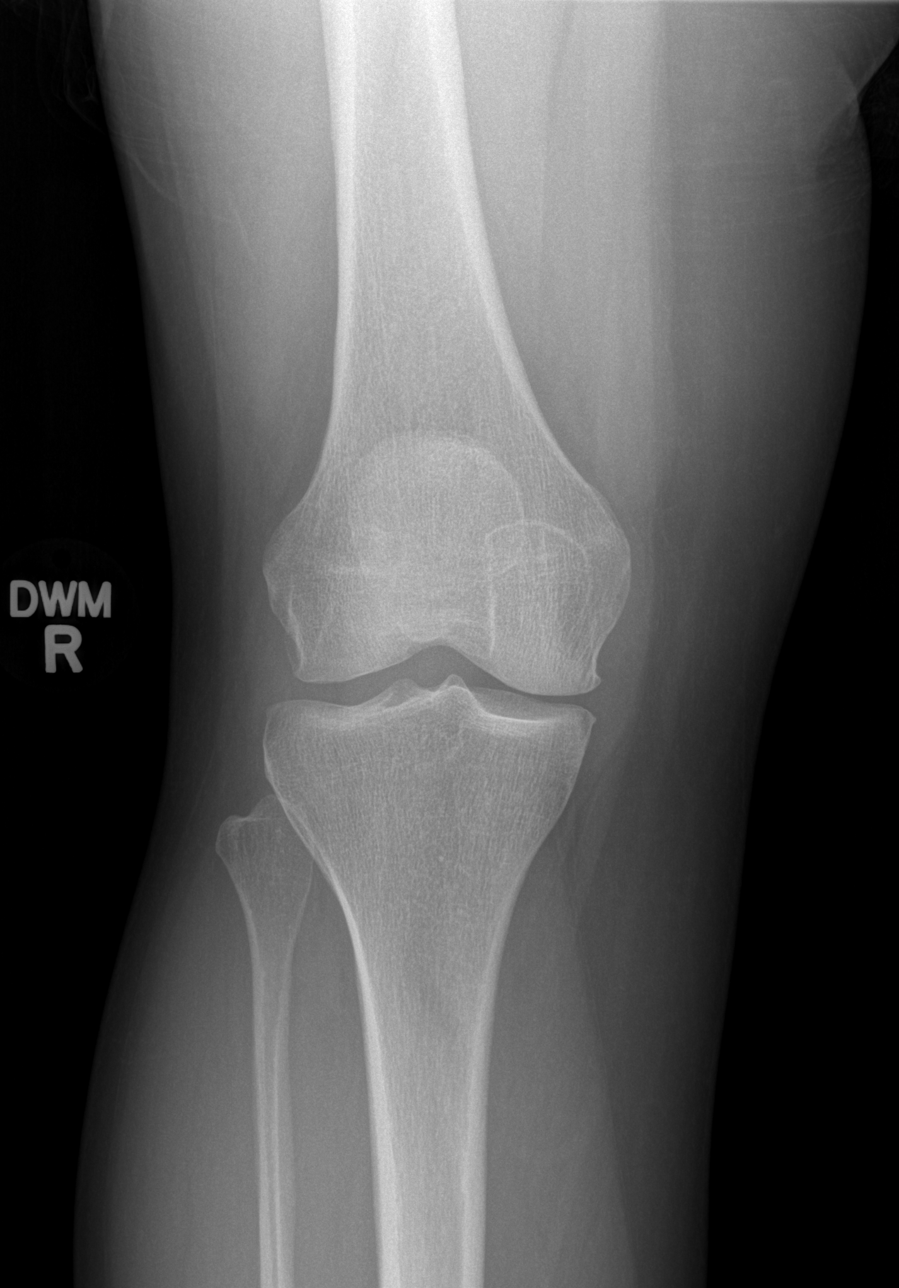

[t knee oblique right (1 of 2)]
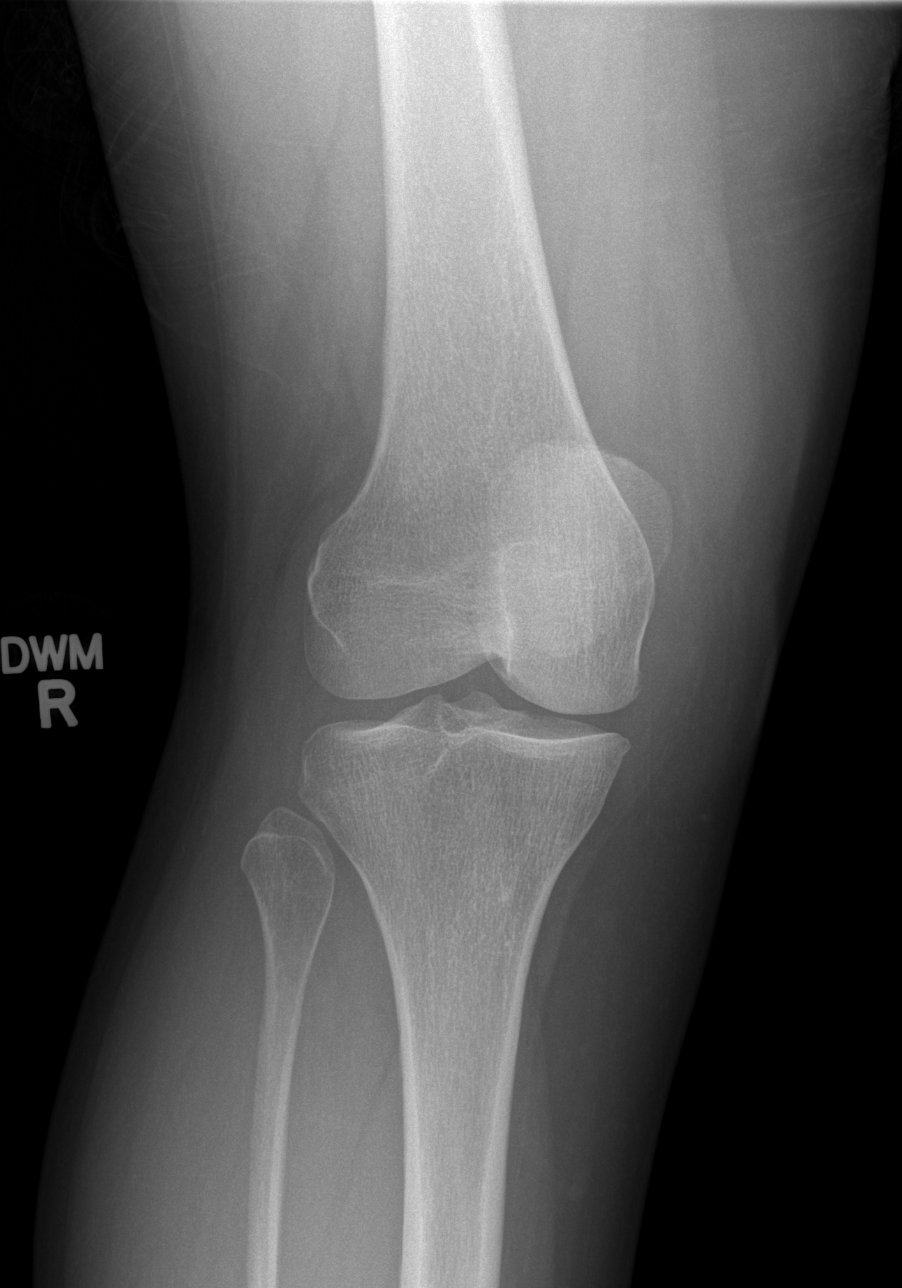

[t knee oblique right (2 of 2)]
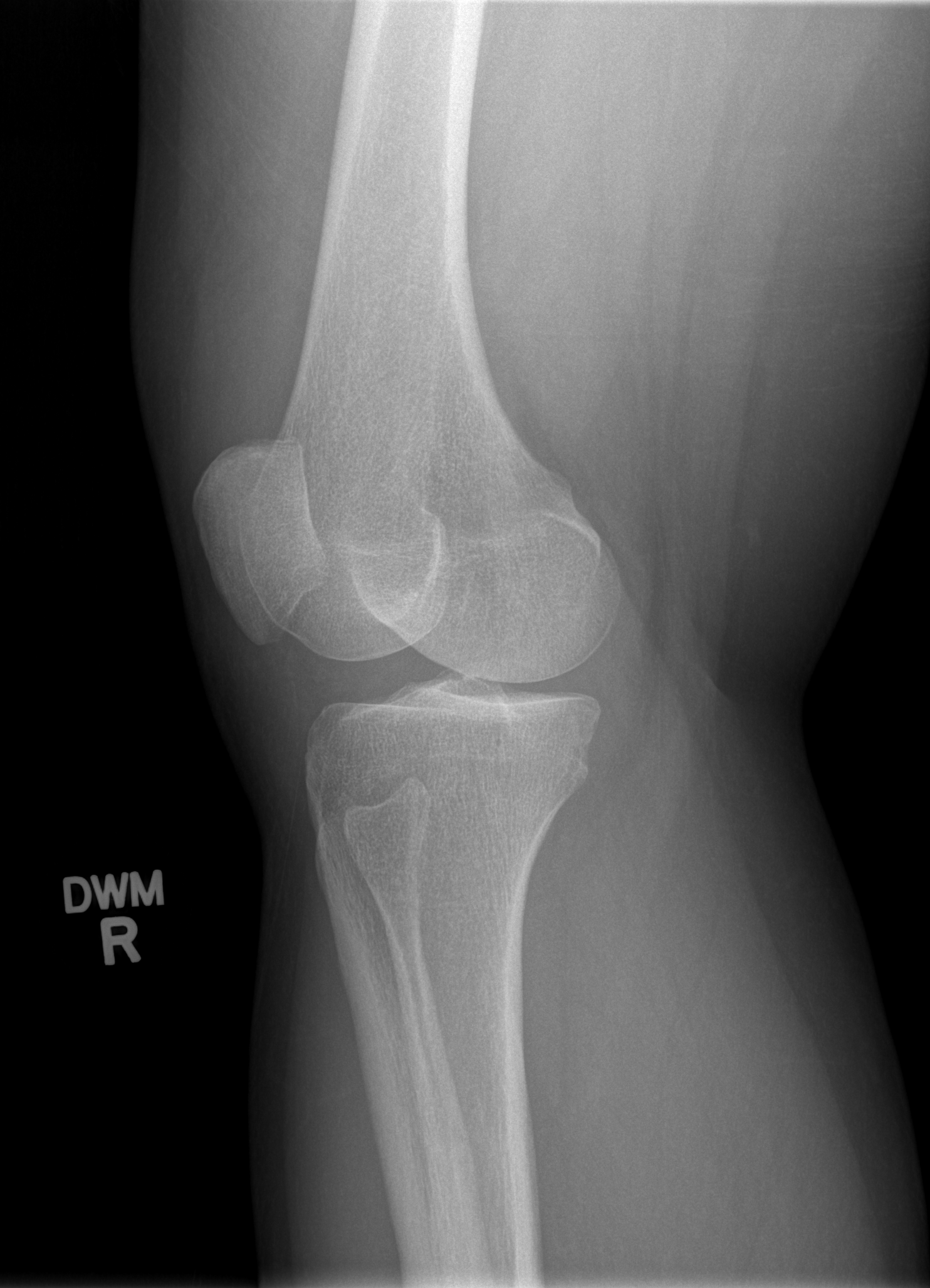

[t knee lat right]
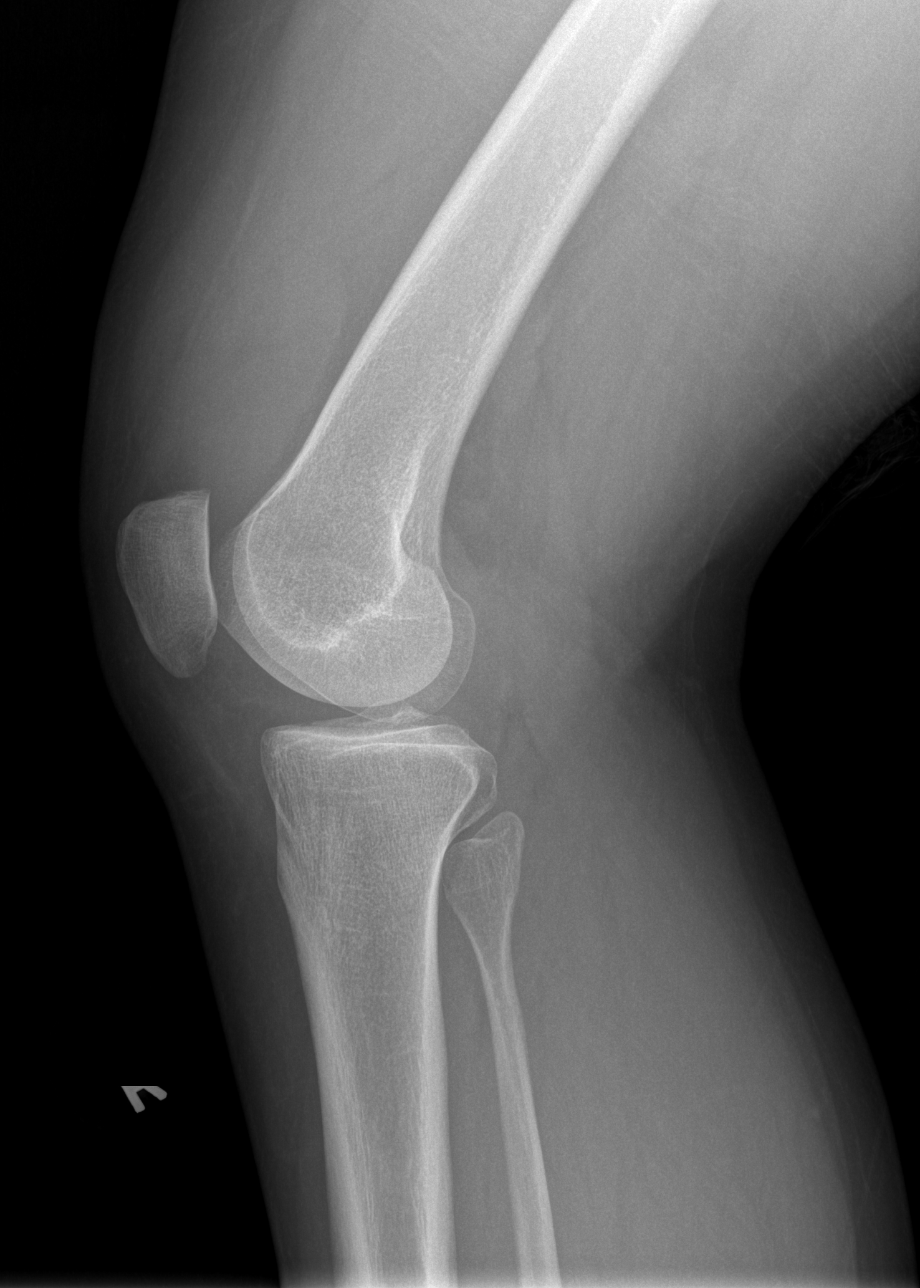

[4 of 4 positions shown; findings below may reference images not displayed]

FINDINGS: Frontal, lateral, and bilateral oblique views were obtained. There
is no apparent fracture or dislocation. There is a joint effusion.
There is minimal joint space narrowing medially with small spurs
medially.
IMPRESSION: Slight osteoarthritic change medially. Joint effusion present. No
fracture or dislocation.

## 2016-11-02 ENCOUNTER — Encounter (HOSPITAL_BASED_OUTPATIENT_CLINIC_OR_DEPARTMENT_OTHER): Payer: Self-pay

## 2016-11-02 ENCOUNTER — Emergency Department (HOSPITAL_BASED_OUTPATIENT_CLINIC_OR_DEPARTMENT_OTHER)
Admission: EM | Admit: 2016-11-02 | Discharge: 2016-11-02 | Disposition: A | Discharge status: Home / Self Care | Payer: Medicaid Other | Attending: Emergency Medicine | Admitting: Emergency Medicine

## 2016-11-02 ENCOUNTER — Emergency Department (HOSPITAL_BASED_OUTPATIENT_CLINIC_OR_DEPARTMENT_OTHER): Payer: Medicaid Other

## 2016-11-02 DIAGNOSIS — J40 Bronchitis, not specified as acute or chronic: Secondary | ICD-10-CM | POA: Diagnosis not present

## 2016-11-02 DIAGNOSIS — K0889 Other specified disorders of teeth and supporting structures: Secondary | ICD-10-CM

## 2016-11-02 DIAGNOSIS — Z79899 Other long term (current) drug therapy: Secondary | ICD-10-CM | POA: Insufficient documentation

## 2016-11-02 DIAGNOSIS — R05 Cough: Secondary | ICD-10-CM | POA: Diagnosis present

## 2016-11-02 DIAGNOSIS — K0381 Cracked tooth: Secondary | ICD-10-CM | POA: Insufficient documentation

## 2016-11-02 LAB — PREGNANCY, URINE: PREG TEST UR: NEGATIVE

## 2016-11-02 MED ORDER — PROMETHAZINE-DM 6.25-15 MG/5ML PO SYRP: 5.0000 mL | ORAL_SOLUTION | Freq: Three times a day (TID) | ORAL | 0 refills | Status: DC | PRN

## 2016-11-02 MED ORDER — KETOROLAC TROMETHAMINE 30 MG/ML IJ SOLN
30.0000 mg | Freq: Once | INTRAMUSCULAR | Status: AC
  Administered 2016-11-02: 30 mg via INTRAMUSCULAR
  Filled 2016-11-02: qty 1

## 2016-11-02 MED ORDER — KETOROLAC TROMETHAMINE 30 MG/ML IJ SOLN: 30.0000 mg | Freq: Once | INTRAMUSCULAR | Status: DC

## 2016-11-02 MED ORDER — BUPIVACAINE-EPINEPHRINE (PF) 0.5% -1:200000 IJ SOLN
1.8000 mL | Freq: Once | INTRAMUSCULAR | Status: AC
  Administered 2016-11-02: 1.8 mL
  Filled 2016-11-02: qty 1.8

## 2016-11-02 MED ORDER — AMOXICILLIN-POT CLAVULANATE 875-125 MG PO TABS: 1.0000 | ORAL_TABLET | Freq: Two times a day (BID) | ORAL | 0 refills | Status: DC

## 2016-11-02 NOTE — ED Notes (Signed)
Pt in xray

## 2016-11-02 NOTE — ED Notes (Signed)
Dr. Clarene DukeLittle notified of pt's increased dental pain

## 2016-11-02 NOTE — Discharge Instructions (Signed)
Follow up with dentist as soon as possible.

## 2016-11-02 NOTE — ED Triage Notes (Signed)
c/o flu like sx x 3 weeks-NAD-steady gait

## 2016-11-02 NOTE — ED Provider Notes (Signed)
[facility]PT Provider Note   CSN: [id number] Arrival date & time: 11/02/16 1608     History    Chief Complaint  Patient presents with  . Cough     HPI Shelly Cummings is a 41 y.o. female.  41yo F w/ PMH including migraines, depression, fibromyalgia who p/w cough. PT reports 3 week hx of cough associated w/ nasal congestion. She reports 5d of sinus pressure. No vomiting, diarrhea, Urinary symptoms, or sore throat. She reports intermittent fevers, up to 101 at home. She has been taking Aleve and other over-the-counter medications with no relief. She also reports pain in a left lower tooth since yesterday when part of the tooth broke off. Pain is severe and constant.   Past Medical History:  Diagnosis Date  . Anxiety   . Complication of anesthesia    she bleed alot after c-sec-hard to clot-no other time-no work-up  . Depression   . Depression   . Fibromyalgia   . Fibromyalgia   . Migraine   . Migraines   . Seizures (HCC) 2002   no reason-none since     There are no active problems to display for this patient.   Past Surgical History:  Procedure Laterality Date  . adenoidectomy    . CESAREAN SECTION    . eardrum repair    . ORIF RADIAL FRACTURE  07/06/2011   Procedure: OPEN REDUCTION INTERNAL FIXATION (ORIF) RADIAL FRACTURE;  Surgeon: Tami RibasKevin R Kuzma;  Location: [facility];  Service: Orthopedics;  Laterality: Left;  ORIF LEFT DISTAL RADIUS  . WISDOM TOOTH EXTRACTION      OB History    No data available        Home Medications    Prior to Admission medications   Medication Sig Start Date End Date Taking? Authorizing Provider  adapalene (DIFFERIN) 0.1 % gel Apply topically at bedtime.     Historical Provider, MD  albuterol (PROVENTIL HFA;VENTOLIN HFA) 108 (90 BASE) MCG/ACT inhaler Inhale 1-2 puffs into the lungs every 6 (six) hours as needed for wheezing or shortness of breath. 09/21/14   Purvis SheffieldForrest Harrison, MD  amoxicillin-clavulanate  (AUGMENTIN) 875-125 MG tablet Take 1 tablet by mouth 2 (two) times daily. 11/02/16   Laurence Spatesachel Morgan Little, MD  DULoxetine (CYMBALTA) 60 MG capsule Take 2 capsules (120 mg total) by mouth every morning. 06/05/12   Yolande JollyAlan H Watt, PA-C  ibuprofen (ADVIL,MOTRIN) 600 MG tablet Take 1 tablet (600 mg total) by mouth every 6 (six) hours as needed. 08/09/14   Fayrene HelperBowie Tran, PA-C  levonorgestrel (MIRENA) 20 MCG/24HR IUD 1 each by Intrauterine route once.      Historical Provider, MD  pregabalin (LYRICA) 150 MG capsule Take 150 mg by mouth 2 (two) times daily.    Historical Provider, MD  traZODone (DESYREL) 100 MG tablet Take 100 mg by mouth at bedtime.    Historical Provider, MD      No family history on file.   Social History  Substance Use Topics  . Smoking status: Never Smoker  . Smokeless tobacco: Never Used  . Alcohol use No     Allergies     Sulfa antibiotics    Review of Systems  10 Systems reviewed and are negative for acute change except as noted in the HPI.   Physical Exam Updated Vital Signs BP (!) 138/91 (BP Location: Right Arm)   Pulse 85   Temp 98.6 F (37 C) (Oral)   Resp 16   Ht 5\' 1"  (1.549  m)   Wt 214 lb (97.1 kg)   SpO2 96%   BMI 40.43 kg/m   Physical Exam  Constitutional: She is oriented to person, place, and time. She appears well-developed and well-nourished. No distress.  HENT:  Head: Normocephalic and atraumatic.  Mouth/Throat:    Moist mucous membranes Posterior portion of tooth #19 broken off, no swelling or drainage at gumline, no facial/jaw swelling  Eyes: Conjunctivae are normal. Pupils are equal, round, and reactive to light.  Neck: Neck supple.  Cardiovascular: Normal rate, regular rhythm and normal heart sounds.   No murmur heard. Pulmonary/Chest: Effort normal and breath sounds normal. She has no wheezes. She has no rales.  Frequent cough  Abdominal: Soft. Bowel sounds are normal. She exhibits no distension. There is no tenderness.    Musculoskeletal: She exhibits no edema.  Neurological: She is alert and oriented to person, place, and time.  Fluent speech  Skin: Skin is warm and dry.  Psychiatric: She has a normal mood and affect. Judgment normal.  Nursing note and vitals reviewed.     ED Treatments / Results  Labs (all labs ordered are listed, but only abnormal results are displayed) Labs Reviewed  PREGNANCY, URINE     EKG  EKG Interpretation  Date/Time:    Ventricular Rate:    PR Interval:    QRS Duration:   QT Interval:    QTC Calculation:   R Axis:     Text Interpretation:           Radiology Dg Chest 2 View  Result Date: 11/02/2016 CLINICAL DATA:  Cough, shortness of breath, and chest congestion for the past 3 weeks. History of asthma. Nonsmoker. EXAM: CHEST  2 VIEW COMPARISON:  Chest x-ray of June 23, 2016 FINDINGS: The lungs are less well inflated on today's study. The interstitial markings are mildly prominent but not greatly changed from the previous study. There is no alveolar infiltrate or pleural effusion. The heart and pulmonary vascularity are normal. The trachea is midline. IMPRESSION: Mild interstitial prominence likely reflects acute bronchitis. There is no alveolar pneumonia nor CHF. Electronically Signed   By: David  [place] M.D.   On: 11/02/2016 16:57    Procedures Procedures (including critical care time) .Nerve Block Date/Time: 11/02/2016 7:18 PM Performed by: Laurence Spates Authorized by: Laurence Spates   Consent:    Consent obtained:  Verbal   Consent given by:  Patient   Alternatives discussed:  No treatment Indications:    Indications:  Pain relief Location:    Body area:  Head   Head nerve blocked: inferior alveolar nerve.   Laterality:  Left Procedure details (see MAR for exact dosages):    Block needle gauge:  27 G   Block anesthetic: marcaine with epi 0.5%   Steroid injected:  None   Additive injected:  None   Injection procedure:   Anatomic landmarks identified, anatomic landmarks palpated and introduced needle Post-procedure details:    Dressing:  None   Outcome:  Pain improved   Patient tolerance of procedure:  Tolerated well, no immediate complications    Medications Ordered in ED  Medications  bupivacaine-epinephrine (MARCAINE W/ EPI) 0.5% -1:200000 injection 1.8 mL (1.8 mLs Infiltration Given 11/02/16 1743)  ketorolac (TORADOL) 30 MG/ML injection 30 mg (30 mg Intramuscular Given 11/02/16 1814)     Initial Impression / Assessment and Plan / ED Course  I have reviewed the triage vital signs and the nursing notes.  Pertinent labs & imaging results that  were available during my care of the patient were reviewed by me and considered in my medical decision making (see chart for details).     PT w/ 3 wk of cough associated w/ viral sx. she was nontoxic on exam with reassuring vital signs, afebrile. Chest x-ray shows findings consistent with bronchitis, no infiltrate. Given her clear lung sounds and normal O2 saturation on room air, normal work of breathing, I do not feel she needs any antibiotic treatment at this time.  CXR negative. I performed a dental block for tooth pain, see procedure note for details. Started patient on antibiotics for tooth pain and instructed that she would need to see a dentist for definitive management. Gave augmentin to cover for tooth as patient concerned about sinus infection and this would cover for sinusitis as well. Given her normal vital signs and reassuring exam, I feel she is safe for discharge. I have discussed supportive care and provided with cough medication to use as needed. Pt has dentist with whom she will follow up. Pain significantly improved after toradol. Reviewed return precautions. Patient voiced understanding and was discharged in satisfactory condition.  Final Clinical Impressions(s) / ED Diagnoses   Final diagnoses:  Bronchitis  Tooth pain     New Prescriptions     AMOXICILLIN-CLAVULANATE (AUGMENTIN) 875-125 MG TABLET    Take 1 tablet by mouth 2 (two) times daily.       Laurence Spates, MD 11/02/16 [phone]

## 2016-11-02 NOTE — ED Notes (Signed)
ED Provider at bedside. 

## 2017-01-02 ENCOUNTER — Other Ambulatory Visit: Payer: Self-pay | Admitting: Physician Assistant

## 2017-01-02 DIAGNOSIS — Z1231 Encounter for screening mammogram for malignant neoplasm of breast: Secondary | ICD-10-CM

## 2018-06-05 ENCOUNTER — Other Ambulatory Visit: Payer: Self-pay | Admitting: Physician Assistant

## 2018-06-05 DIAGNOSIS — Z1231 Encounter for screening mammogram for malignant neoplasm of breast: Secondary | ICD-10-CM

## 2018-07-09 ENCOUNTER — Emergency Department (HOSPITAL_COMMUNITY): Payer: Medicaid Other

## 2018-07-09 ENCOUNTER — Encounter (HOSPITAL_COMMUNITY): Payer: Self-pay

## 2018-07-09 ENCOUNTER — Ambulatory Visit: Payer: Self-pay

## 2018-07-09 ENCOUNTER — Emergency Department (HOSPITAL_COMMUNITY)
Admission: EM | Admit: 2018-07-09 | Discharge: 2018-07-09 | Disposition: A | Discharge status: Home / Self Care | Payer: Medicaid Other | Attending: Emergency Medicine | Admitting: Emergency Medicine

## 2018-07-09 ENCOUNTER — Other Ambulatory Visit: Payer: Self-pay

## 2018-07-09 DIAGNOSIS — F419 Anxiety disorder, unspecified: Secondary | ICD-10-CM | POA: Insufficient documentation

## 2018-07-09 DIAGNOSIS — Z79899 Other long term (current) drug therapy: Secondary | ICD-10-CM | POA: Diagnosis not present

## 2018-07-09 DIAGNOSIS — F17210 Nicotine dependence, cigarettes, uncomplicated: Secondary | ICD-10-CM | POA: Diagnosis not present

## 2018-07-09 DIAGNOSIS — F329 Major depressive disorder, single episode, unspecified: Secondary | ICD-10-CM | POA: Diagnosis not present

## 2018-07-09 DIAGNOSIS — F1510 Other stimulant abuse, uncomplicated: Secondary | ICD-10-CM | POA: Insufficient documentation

## 2018-07-09 HISTORY — DX: Post-traumatic stress disorder, unspecified: F43.10

## 2018-07-09 HISTORY — DX: Other psychoactive substance abuse, uncomplicated: F19.10

## 2018-07-09 LAB — CBC
HEMATOCRIT: 47.7 % — AB (ref 36.0–46.0)
HEMOGLOBIN: 16 g/dL — AB (ref 12.0–15.0)
MCH: 28.7 pg (ref 26.0–34.0)
MCHC: 33.5 g/dL (ref 30.0–36.0)
MCV: 85.5 fL (ref 80.0–100.0)
Platelets: 277 10*3/uL (ref 150–400)
RBC: 5.58 MIL/uL — ABNORMAL HIGH (ref 3.87–5.11)
RDW: 12.9 % (ref 11.5–15.5)
WBC: 14.1 10*3/uL — ABNORMAL HIGH (ref 4.0–10.5)
nRBC: 0 % (ref 0.0–0.2)

## 2018-07-09 LAB — COMPREHENSIVE METABOLIC PANEL
ALT: 24 U/L (ref 0–44)
AST: 60 U/L — ABNORMAL HIGH (ref 15–41)
Albumin: 4.7 g/dL (ref 3.5–5.0)
Alkaline Phosphatase: 76 U/L (ref 38–126)
Anion gap: 13 (ref 5–15)
BUN: 14 mg/dL (ref 6–20)
CO2: 27 mmol/L (ref 22–32)
Calcium: 10.2 mg/dL (ref 8.9–10.3)
Chloride: 95 mmol/L — ABNORMAL LOW (ref 98–111)
Creatinine, Ser: 0.67 mg/dL (ref 0.44–1.00)
Glucose, Bld: 123 mg/dL — ABNORMAL HIGH (ref 70–99)
POTASSIUM: 3.9 mmol/L (ref 3.5–5.1)
SODIUM: 135 mmol/L (ref 135–145)
TOTAL PROTEIN: 8.6 g/dL — AB (ref 6.5–8.1)
Total Bilirubin: 1.1 mg/dL (ref 0.3–1.2)

## 2018-07-09 NOTE — Patient Outreach (Signed)
[facility] met with the patient to provide substance use recovery support and help with substance use treatment resources. Patient states that she plans to follow up with NA/AA meetings along with follow up with her MAT treatment at [facility]. 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 CPSS provided information for residential/outpatient substance use treatment center list, NA meeting list, a flier for [facility] substance use treatment program, and [facility] contact information. [facility] strongly encouraged the to follow up with [facility] after discharge from the [facility] if needed for further help with substance use recovery resources.

## 2018-07-09 NOTE — ED Triage Notes (Signed)
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 Patient states  She last used 1-2 days ago. Patient normally goes to the methadone clinic but has not had x 2 days. Patient's mother reports that they had to do a wellness check today and the patient crawled to the door, was paranoid, and had urinated on herself. Patient denies any SI/HI, visual or auditory hallucinations.

## 2018-07-09 NOTE — ED Notes (Signed)
Bed: [facility] Expected date:  Expected time:  Means of arrival:  Comments: 

## 2018-07-09 NOTE — Discharge Instructions (Addendum)
It was our pleasure to provide your ER care today - we hope that you feel better.  We have made a referral to our [facility] - a representative should be contacting you shortly.  Also, please see resource guide provided for additional community resources, including substance abuse treatment.   Follow up with primary care doctor in the coming week. Your blood pressure is high today - have it rechecked then as well.   For mental health issues and/or crisis, [facility]  Return to ER if worse, new symptoms, fevers, persistent vomiting, trouble breathing, other concern.

## 2018-07-09 NOTE — ED Provider Notes (Signed)
[facility]-EMERGENCY DEPT Provider Note   CSN: [id number] Arrival date & time: 07/09/18  1030     History   Chief Complaint Chief Complaint  Patient presents with  . detox from meth    HPI Shelly Cummings is a 42 y.o. female.  Patient presents requesting detox for methamphetamine use. 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 Pt notes hx anxiety. Denies depression. Denies thoughts of harm to self or others. States when smokes meth, does have paranoid thoughts. No current delusions or hallucinations. States physical health at baseline, no acute illness. No fever or chills. Normal appetite.   The history is provided by the patient and a parent.    Past Medical History:  Diagnosis Date  . Anxiety   . Complication of anesthesia    she bleed alot after c-sec-hard to clot-no other time-no work-up  . Depression   . Depression   . Fibromyalgia   . Fibromyalgia   . Migraine   . Migraines   . PTSD (post-traumatic stress disorder)   . Seizures (HCC) 2002   no reason-none since  . Substance abuse (HCC)     There are no active problems to display for this patient.   Past Surgical History:  Procedure Laterality Date  . adenoidectomy    . CESAREAN SECTION    . eardrum repair    . ORIF RADIAL FRACTURE  07/06/2011   Procedure: OPEN REDUCTION INTERNAL FIXATION (ORIF) RADIAL FRACTURE;  Surgeon: Tami Ribas;  Location: [facility];  Service: Orthopedics;  Laterality: Left;  ORIF LEFT DISTAL RADIUS  . WISDOM TOOTH EXTRACTION       OB History   None      Home Medications    Prior to Admission medications   Medication Sig Start Date End Date Taking? Authorizing Provider  adapalene (DIFFERIN) 0.1 % gel Apply topically at bedtime.     [provider]  albuterol (PROVENTIL HFA;VENTOLIN HFA) 108 (90 BASE) MCG/ACT inhaler Inhale 1-2 puffs into the lungs every 6 (six) hours as needed for wheezing or  shortness of breath. 09/21/14   Purvis Sheffield, MD  amoxicillin-clavulanate (AUGMENTIN) 875-125 MG tablet Take 1 tablet by mouth 2 (two) times daily. 11/02/16   Little, Ambrose Finland, MD  DULoxetine (CYMBALTA) 60 MG capsule Take 2 capsules (120 mg total) by mouth every morning. 06/05/12   Yolande Jolly, PA-C  ibuprofen (ADVIL,MOTRIN) 600 MG tablet Take 1 tablet (600 mg total) by mouth every 6 (six) hours as needed. 08/09/14   Fayrene Helper, PA-C  levonorgestrel (MIRENA) 20 MCG/24HR IUD 1 each by Intrauterine route once.      [provider]  pregabalin (LYRICA) 150 MG capsule Take 150 mg by mouth 2 (two) times daily.    [provider]  promethazine-dextromethorphan (PROMETHAZINE-DM) 6.25-15 MG/5ML syrup Take 5 mLs by mouth 3 (three) times daily as needed for cough. 11/02/16   Little, Ambrose Finland, MD  traZODone (DESYREL) 100 MG tablet Take 100 mg by mouth at bedtime.    [provider]    Family History Family History  Problem Relation Age of Onset  . Cancer Mother   . Depression Mother   . Cancer Father   . Alcohol abuse Father   . Anxiety disorder Father   . Depression Father     Social History Social History   Tobacco Use  . Smoking status: Current Some Day Smoker    Types: Cigarettes  .  Smokeless tobacco: Never Used  Substance Use Topics  . Alcohol use: No  . Drug use: Yes    Types: Amphetamines, Other-see comments, Methamphetamines     Allergies   Sulfa antibiotics   Review of Systems Review of Systems  Constitutional: Negative for fever.  HENT: Negative for sore throat.   Eyes: Negative for redness.  Respiratory: Negative for shortness of breath.        Occasional non prod cough  Cardiovascular: Negative for chest pain.  Gastrointestinal: Negative for abdominal pain and vomiting.  Genitourinary: Negative for dysuria, flank pain and frequency.  Musculoskeletal: Negative for back pain.  Skin: Negative for rash.  Neurological: Negative  for headaches.  Hematological: Does not bruise/bleed easily.  Psychiatric/Behavioral: Negative for confusion.     Physical Exam Updated Vital Signs BP (!) 149/103 (BP Location: Left Arm)   Pulse (!) 106   Temp 99 F (37.2 C) (Oral)   Resp 16   Ht 1.549 m (5\' 1" )   Wt 90.7 kg   SpO2 100%   BMI 37.79 kg/m   Physical Exam  Constitutional: She appears well-developed and well-nourished.  HENT:  Mouth/Throat: Oropharynx is clear and moist.  Eyes: Pupils are equal, round, and reactive to light. Conjunctivae are normal. No scleral icterus.  Neck: Neck supple. No tracheal deviation present.  Cardiovascular: Normal rate, regular rhythm, normal heart sounds and intact distal pulses. Exam reveals no gallop and no friction rub.  No murmur heard. Pulmonary/Chest: Effort normal and breath sounds normal. No respiratory distress.  Abdominal: Soft. Normal appearance and bowel sounds are normal. She exhibits no distension. There is no tenderness.  Musculoskeletal: She exhibits no edema.  Neurological: She is alert.  Speech clear/fluent. Steady gait.   Skin: Skin is warm and dry. No rash noted.  Psychiatric: She has a normal mood and affect.  No SI. No hallucinations or delusions.   Nursing note and vitals reviewed.    ED Treatments / Results  Labs (all labs ordered are listed, but only abnormal results are displayed) Results for orders placed or performed during the hospital encounter of 07/09/18  Comprehensive metabolic panel  Result Value Ref Range   Sodium 135 135 - 145 mmol/L   Potassium 3.9 3.5 - 5.1 mmol/L   Chloride 95 (L) 98 - 111 mmol/L   CO2 27 22 - 32 mmol/L   Glucose, Bld 123 (H) 70 - 99 mg/dL   BUN 14 6 - 20 mg/dL   Creatinine, Ser 1.91 0.44 - 1.00 mg/dL   Calcium 47.8 8.9 - 29.5 mg/dL   Total Protein 8.6 (H) 6.5 - 8.1 g/dL   Albumin 4.7 3.5 - 5.0 g/dL   AST 60 (H) 15 - 41 U/L   ALT 24 0 - 44 U/L   Alkaline Phosphatase 76 38 - 126 U/L   Total Bilirubin 1.1 0.3 -  1.2 mg/dL   GFR calc non Af Amer >60 >60 mL/min   GFR calc Af Amer >60 >60 mL/min   Anion gap 13 5 - 15  CBC  Result Value Ref Range   WBC 14.1 (H) 4.0 - 10.5 K/uL   RBC 5.58 (H) 3.87 - 5.11 MIL/uL   Hemoglobin 16.0 (H) 12.0 - 15.0 g/dL   HCT 62.1 (H) 30.8 - 65.7 %   MCV 85.5 80.0 - 100.0 fL   MCH 28.7 26.0 - 34.0 pg   MCHC 33.5 30.0 - 36.0 g/dL   RDW 84.6 96.2 - 95.2 %   Platelets 277 150 -  400 K/uL   nRBC 0.0 0.0 - 0.2 %   Dg Chest 2 View  Result Date: 07/09/2018 CLINICAL DATA:  Shortness of breath, cough, history of smoking, paranoia. EXAM: CHEST - 2 VIEW COMPARISON:  PA and lateral chest x-ray of November 02, 2016 FINDINGS: The lungs are adequately inflated. There is no focal infiltrate. The interstitial markings are mildly prominent but stable. The heart and pulmonary vascularity are normal. The mediastinum is normal in width. The trachea is midline. The bony thorax exhibits no acute abnormality. IMPRESSION: Mild chronic bronchitic-smoking related changes. No active cardiopulmonary disease. Electronically Signed   By: David  SwazilandJordan M.D.   On: 07/09/2018 13:56    EKG None  Radiology Dg Chest 2 View  Result Date: 07/09/2018 CLINICAL DATA:  Shortness of breath, cough, history of smoking, paranoia. EXAM: CHEST - 2 VIEW COMPARISON:  PA and lateral chest x-ray of November 02, 2016 FINDINGS: The lungs are adequately inflated. There is no focal infiltrate. The interstitial markings are mildly prominent but stable. The heart and pulmonary vascularity are normal. The mediastinum is normal in width. The trachea is midline. The bony thorax exhibits no acute abnormality. IMPRESSION: Mild chronic bronchitic-smoking related changes. No active cardiopulmonary disease. Electronically Signed   By: David  [place] M.D.   On: 07/09/2018 13:56    Procedures Procedures (including critical care time)  Medications Ordered in ED Medications - No data to display   Initial Impression / Assessment and Plan  / ED Course  I have reviewed the triage vital signs and the nursing notes.  Pertinent labs & imaging results that were available during my care of the patient were reviewed by me and considered in my medical decision making (see chart for details).  Labs sent from triage.  Reviewed nursing notes and prior charts for additional history.   Discussed w pt/parent that we do not offer drug rehab/detox at this facility, however, we will provide resource guide for community resources for substance abuse treatment.   Given hx anxiety, also recommend outpatient mental health follow up -  Resource guide provided.   Labs reviewed - chem normal.   cxr reviewed - no pna.  Pt denies fever or chills. Denies dysuria or gu c/o. No abd pain or tenderness. occ non prod cough, cxr neg.   Pt denies acute depression or thoughts of self harm.  Will also make a peer support referral to help facilitate support and outpt f/u.   Pt currently appears stable for d/c.     Final Clinical Impressions(s) / ED Diagnoses   Final diagnoses:  None    ED Discharge Orders    None       Cathren LaineSteinl, Ellissa Ayo, MD 07/09/18 [id number]

## 2018-07-09 NOTE — ED Notes (Signed)
Patient transported to X-ray 

## 2018-08-29 ENCOUNTER — Ambulatory Visit: Payer: Self-pay

## 2018-09-25 ENCOUNTER — Ambulatory Visit: Payer: Self-pay

## 2018-11-19 ENCOUNTER — Ambulatory Visit: Payer: Self-pay

## 2018-12-20 ENCOUNTER — Ambulatory Visit: Payer: Self-pay

## 2019-02-04 ENCOUNTER — Ambulatory Visit
Admission: RE | Admit: 2019-02-04 | Discharge: 2019-02-04 | Disposition: A | Discharge status: Home / Self Care | Payer: Medicaid Other | Source: Ambulatory Visit | Attending: Physician Assistant | Admitting: Physician Assistant

## 2019-02-04 ENCOUNTER — Other Ambulatory Visit: Payer: Self-pay

## 2019-02-04 DIAGNOSIS — Z1231 Encounter for screening mammogram for malignant neoplasm of breast: Secondary | ICD-10-CM

## 2019-11-19 ENCOUNTER — Observation Stay (HOSPITAL_COMMUNITY): Payer: Medicaid Other

## 2019-11-19 ENCOUNTER — Other Ambulatory Visit: Payer: Self-pay

## 2019-11-19 ENCOUNTER — Emergency Department (HOSPITAL_COMMUNITY): Payer: Medicaid Other

## 2019-11-19 ENCOUNTER — Observation Stay (HOSPITAL_COMMUNITY)
Admission: EM | Admit: 2019-11-19 | Discharge: 2019-11-20 | Disposition: A | Discharge status: Home / Self Care | Payer: Medicaid Other | Attending: Family Medicine | Admitting: Family Medicine

## 2019-11-19 ENCOUNTER — Encounter (HOSPITAL_COMMUNITY): Payer: Self-pay

## 2019-11-19 DIAGNOSIS — Z20822 Contact with and (suspected) exposure to covid-19: Secondary | ICD-10-CM | POA: Diagnosis not present

## 2019-11-19 DIAGNOSIS — Z793 Long term (current) use of hormonal contraceptives: Secondary | ICD-10-CM | POA: Insufficient documentation

## 2019-11-19 DIAGNOSIS — F17200 Nicotine dependence, unspecified, uncomplicated: Secondary | ICD-10-CM | POA: Insufficient documentation

## 2019-11-19 DIAGNOSIS — S2231XA Fracture of one rib, right side, initial encounter for closed fracture: Secondary | ICD-10-CM | POA: Insufficient documentation

## 2019-11-19 DIAGNOSIS — Y92410 Unspecified street and highway as the place of occurrence of the external cause: Secondary | ICD-10-CM | POA: Diagnosis not present

## 2019-11-19 DIAGNOSIS — R0902 Hypoxemia: Secondary | ICD-10-CM | POA: Diagnosis not present

## 2019-11-19 DIAGNOSIS — J440 Chronic obstructive pulmonary disease with acute lower respiratory infection: Secondary | ICD-10-CM | POA: Diagnosis not present

## 2019-11-19 DIAGNOSIS — Z79891 Long term (current) use of opiate analgesic: Secondary | ICD-10-CM | POA: Diagnosis not present

## 2019-11-19 DIAGNOSIS — M549 Dorsalgia, unspecified: Secondary | ICD-10-CM | POA: Diagnosis not present

## 2019-11-19 DIAGNOSIS — K589 Irritable bowel syndrome without diarrhea: Secondary | ICD-10-CM | POA: Insufficient documentation

## 2019-11-19 DIAGNOSIS — G43909 Migraine, unspecified, not intractable, without status migrainosus: Secondary | ICD-10-CM | POA: Insufficient documentation

## 2019-11-19 DIAGNOSIS — G8929 Other chronic pain: Secondary | ICD-10-CM | POA: Insufficient documentation

## 2019-11-19 DIAGNOSIS — Z79899 Other long term (current) drug therapy: Secondary | ICD-10-CM | POA: Insufficient documentation

## 2019-11-19 DIAGNOSIS — M797 Fibromyalgia: Secondary | ICD-10-CM | POA: Insufficient documentation

## 2019-11-19 DIAGNOSIS — K219 Gastro-esophageal reflux disease without esophagitis: Secondary | ICD-10-CM | POA: Insufficient documentation

## 2019-11-19 DIAGNOSIS — S301XXA Contusion of abdominal wall, initial encounter: Secondary | ICD-10-CM

## 2019-11-19 DIAGNOSIS — F1110 Opioid abuse, uncomplicated: Secondary | ICD-10-CM | POA: Insufficient documentation

## 2019-11-19 DIAGNOSIS — Z882 Allergy status to sulfonamides status: Secondary | ICD-10-CM | POA: Diagnosis not present

## 2019-11-19 DIAGNOSIS — F419 Anxiety disorder, unspecified: Secondary | ICD-10-CM | POA: Insufficient documentation

## 2019-11-19 DIAGNOSIS — F329 Major depressive disorder, single episode, unspecified: Secondary | ICD-10-CM | POA: Insufficient documentation

## 2019-11-19 DIAGNOSIS — J189 Pneumonia, unspecified organism: Principal | ICD-10-CM | POA: Insufficient documentation

## 2019-11-19 DIAGNOSIS — Z8669 Personal history of other diseases of the nervous system and sense organs: Secondary | ICD-10-CM | POA: Insufficient documentation

## 2019-11-19 LAB — COMPREHENSIVE METABOLIC PANEL
ALT: 18 U/L (ref 0–44)
AST: 23 U/L (ref 15–41)
Albumin: 3.5 g/dL (ref 3.5–5.0)
Alkaline Phosphatase: 73 U/L (ref 38–126)
Anion gap: 9 (ref 5–15)
BUN: 6 mg/dL (ref 6–20)
CO2: 30 mmol/L (ref 22–32)
Calcium: 9.6 mg/dL (ref 8.9–10.3)
Chloride: 98 mmol/L (ref 98–111)
Creatinine, Ser: 0.86 mg/dL (ref 0.44–1.00)
GFR calc Af Amer: >60 mL/min (ref >60)
GFR calc non Af Amer: >60 mL/min (ref >60)
Glucose, Bld: 113 mg/dL — ABNORMAL HIGH (ref 70–99)
Potassium: 4.7 mmol/L (ref 3.5–5.1)
Sodium: 137 mmol/L (ref 135–145)
Total Bilirubin: 1.2 mg/dL (ref 0.3–1.2)
Total Protein: 6.9 g/dL (ref 6.5–8.1)

## 2019-11-19 LAB — CBC WITH DIFFERENTIAL/PLATELET
Abs Immature Granulocytes: 0.04 10*3/uL (ref 0.00–0.07)
Basophils Absolute: 0 10*3/uL (ref 0.0–0.1)
Basophils Relative: 0 %
Eosinophils Absolute: 0 10*3/uL (ref 0.0–0.5)
Eosinophils Relative: 0 %
HCT: 37.1 % (ref 36.0–46.0)
Hemoglobin: 11.9 g/dL — ABNORMAL LOW (ref 12.0–15.0)
Immature Granulocytes: 0 %
Lymphocytes Relative: 11 %
Lymphs Abs: 1.7 10*3/uL (ref 0.7–4.0)
MCH: 27.9 pg (ref 26.0–34.0)
MCHC: 32.1 g/dL (ref 30.0–36.0)
MCV: 87.1 fL (ref 80.0–100.0)
Monocytes Absolute: 0.8 10*3/uL (ref 0.1–1.0)
Monocytes Relative: 5 %
Neutro Abs: 13.4 10*3/uL — ABNORMAL HIGH (ref 1.7–7.7)
Neutrophils Relative %: 84 %
Platelets: 252 10*3/uL (ref 150–400)
RBC: 4.26 MIL/uL (ref 3.87–5.11)
RDW: 13.5 % (ref 11.5–15.5)
WBC: 15.9 10*3/uL — ABNORMAL HIGH (ref 4.0–10.5)
nRBC: 0 % (ref 0.0–0.2)

## 2019-11-19 LAB — RESPIRATORY PANEL BY RT PCR (FLU A&B, COVID)
Influenza A by PCR: NEGATIVE
Influenza B by PCR: NEGATIVE
SARS Coronavirus 2 by RT PCR: NEGATIVE

## 2019-11-19 LAB — HIV ANTIBODY (ROUTINE TESTING W REFLEX): HIV Screen 4th Generation wRfx: NONREACTIVE

## 2019-11-19 LAB — I-STAT BETA HCG BLOOD, ED (MC, WL, AP ONLY): I-stat hCG, quantitative: <5 m[IU]/mL (ref <5)

## 2019-11-19 LAB — BRAIN NATRIURETIC PEPTIDE: B Natriuretic Peptide: 68 pg/mL (ref 0.0–100.0)

## 2019-11-19 MED ORDER — SUMATRIPTAN SUCCINATE 100 MG PO TABS
100.0000 mg | ORAL_TABLET | Freq: Once | ORAL | Status: DC | PRN
  Administered 2019-11-19: 100 mg via ORAL
  Filled 2019-11-19: qty 1

## 2019-11-19 MED ORDER — ENOXAPARIN SODIUM 40 MG/0.4ML ~~LOC~~ SOLN
40.0000 mg | SUBCUTANEOUS | Status: DC
  Administered 2019-11-19: 40 mg via SUBCUTANEOUS
  Filled 2019-11-19: qty 0.4

## 2019-11-19 MED ORDER — ONDANSETRON 4 MG PO TBDP: 8.0000 mg | ORAL_TABLET | Freq: Three times a day (TID) | ORAL | Status: DC | PRN

## 2019-11-19 MED ORDER — LUBIPROSTONE 24 MCG PO CAPS
24.0000 ug | ORAL_CAPSULE | Freq: Two times a day (BID) | ORAL | Status: DC
  Administered 2019-11-20: 24 ug via ORAL
  Filled 2019-11-19 (×2): qty 1

## 2019-11-19 MED ORDER — DULOXETINE HCL 60 MG PO CPEP
120.0000 mg | ORAL_CAPSULE | Freq: Every morning | ORAL | Status: DC
  Administered 2019-11-20: 120 mg via ORAL
  Filled 2019-11-19: qty 2

## 2019-11-19 MED ORDER — SODIUM CHLORIDE 0.9 % IV SOLN
1.0000 g | Freq: Once | INTRAVENOUS | Status: AC
  Administered 2019-11-19: 1 g via INTRAVENOUS
  Filled 2019-11-19: qty 10

## 2019-11-19 MED ORDER — ALBUTEROL SULFATE (2.5 MG/3ML) 0.083% IN NEBU: 2.5000 mg | INHALATION_SOLUTION | Freq: Four times a day (QID) | RESPIRATORY_TRACT | Status: DC | PRN

## 2019-11-19 MED ORDER — TRAZODONE HCL 50 MG PO TABS
50.0000 mg | ORAL_TABLET | Freq: Every day | ORAL | Status: DC
  Administered 2019-11-19: 50 mg via ORAL
  Filled 2019-11-19: qty 1

## 2019-11-19 MED ORDER — PREGABALIN 75 MG PO CAPS
225.0000 mg | ORAL_CAPSULE | Freq: Two times a day (BID) | ORAL | Status: DC
  Administered 2019-11-19 – 2019-11-20 (×2): 225 mg via ORAL
  Filled 2019-11-19: qty 2
  Filled 2019-11-19: qty 3

## 2019-11-19 MED ORDER — PREGABALIN 25 MG PO CAPS: 150.0000 mg | ORAL_CAPSULE | Freq: Two times a day (BID) | ORAL | Status: DC

## 2019-11-19 MED ORDER — SODIUM CHLORIDE 0.9 % IV SOLN
500.0000 mg | Freq: Once | INTRAVENOUS | Status: DC
  Administered 2019-11-19: 500 mg via INTRAVENOUS
  Filled 2019-11-19: qty 500

## 2019-11-19 MED ORDER — ACETAMINOPHEN 650 MG RE SUPP: 650.0000 mg | Freq: Four times a day (QID) | RECTAL | Status: DC | PRN

## 2019-11-19 MED ORDER — TRAMADOL HCL 50 MG PO TABS
50.0000 mg | ORAL_TABLET | Freq: Four times a day (QID) | ORAL | Status: DC | PRN
  Administered 2019-11-19: 50 mg via ORAL
  Filled 2019-11-19: qty 1

## 2019-11-19 MED ORDER — IOHEXOL 350 MG/ML SOLN
100.0000 mL | Freq: Once | INTRAVENOUS | Status: AC | PRN
  Administered 2019-11-19: 100 mL via INTRAVENOUS

## 2019-11-19 MED ORDER — POLYETHYLENE GLYCOL 3350 17 G PO PACK: 17.0000 g | PACK | Freq: Every day | ORAL | Status: DC | PRN

## 2019-11-19 MED ORDER — RISPERIDONE 2 MG PO TABS
2.0000 mg | ORAL_TABLET | Freq: Every day | ORAL | Status: DC
  Administered 2019-11-19: 2 mg via ORAL
  Filled 2019-11-19 (×2): qty 1

## 2019-11-19 MED ORDER — PANTOPRAZOLE SODIUM 40 MG PO TBEC
40.0000 mg | DELAYED_RELEASE_TABLET | Freq: Every day | ORAL | Status: DC
  Administered 2019-11-19 – 2019-11-20 (×2): 40 mg via ORAL
  Filled 2019-11-19 (×2): qty 1

## 2019-11-19 MED ORDER — ACETAMINOPHEN 325 MG PO TABS: 650.0000 mg | ORAL_TABLET | Freq: Four times a day (QID) | ORAL | Status: DC | PRN
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
## 2019-11-20 DIAGNOSIS — J189 Pneumonia, unspecified organism: Secondary | ICD-10-CM | POA: Diagnosis not present

## 2019-11-20 LAB — CBC
HCT: 36.4 % (ref 36.0–46.0)
Hemoglobin: 11.3 g/dL — ABNORMAL LOW (ref 12.0–15.0)
MCH: 27.5 pg (ref 26.0–34.0)
MCHC: 31 g/dL (ref 30.0–36.0)
MCV: 88.6 fL (ref 80.0–100.0)
Platelets: 200 10*3/uL (ref 150–400)
RBC: 4.11 MIL/uL (ref 3.87–5.11)
RDW: 13.6 % (ref 11.5–15.5)
WBC: 14.5 10*3/uL — ABNORMAL HIGH (ref 4.0–10.5)
nRBC: 0 % (ref 0.0–0.2)

## 2019-11-20 LAB — BASIC METABOLIC PANEL
Anion gap: 11 (ref 5–15)
BUN: 8 mg/dL (ref 6–20)
CO2: 24 mmol/L (ref 22–32)
Calcium: 8.9 mg/dL (ref 8.9–10.3)
Chloride: 103 mmol/L (ref 98–111)
Creatinine, Ser: 0.76 mg/dL (ref 0.44–1.00)
GFR calc Af Amer: >60 mL/min (ref >60)
GFR calc non Af Amer: >60 mL/min (ref >60)
Glucose, Bld: 108 mg/dL — ABNORMAL HIGH (ref 70–99)
Potassium: 4 mmol/L (ref 3.5–5.1)
Sodium: 138 mmol/L (ref 135–145)

## 2019-11-20 LAB — SAMPLE TO BLOOD BANK

## 2019-11-20 MED ORDER — ACETAMINOPHEN 325 MG PO TABS: 650.0000 mg | ORAL_TABLET | Freq: Four times a day (QID) | ORAL | Status: AC | PRN

## 2019-11-20 MED ORDER — METHADONE HCL 5 MG PO TABS
55.0000 mg | ORAL_TABLET | Freq: Every day | ORAL | Status: DC
  Administered 2019-11-20: 55 mg via ORAL
  Filled 2019-11-20: qty 1

## 2019-11-20 MED ORDER — AMOXICILLIN-POT CLAVULANATE 875-125 MG PO TABS: 1.0000 | ORAL_TABLET | Freq: Two times a day (BID) | ORAL | 0 refills | Status: AC

## 2019-11-20 MED ORDER — AZITHROMYCIN 250 MG PO TABS: 250.0000 mg | ORAL_TABLET | Freq: Every day | ORAL | 0 refills | Status: AC

## 2019-11-20 MED ORDER — AZITHROMYCIN 250 MG PO TABS
250.0000 mg | ORAL_TABLET | ORAL | Status: DC
  Administered 2019-11-20: 250 mg via ORAL
  Filled 2019-11-20: qty 1

## 2019-11-20 MED ORDER — TRAZODONE HCL 50 MG PO TABS: 50.0000 mg | ORAL_TABLET | Freq: Every day | ORAL | Status: DC

## 2019-11-20 MED FILL — AZITHROMYCIN 250 MG TABLET: 250 | 4 days supply | Qty: 4 | Fill #0

## 2019-11-20 MED FILL — AMOX-CLAV 875-125 MG TABLET: 875-125 | 5 days supply | Qty: 10 | Fill #0
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
## 2019-11-20 NOTE — Final Progress Note (Signed)
New order to discharge patient home.  IV discontinued.  AVS completed and reviewed with patient.  All questions answered.  Patient will to transported to main entrance where family will provide transportation home.

## 2020-03-08 ENCOUNTER — Other Ambulatory Visit: Payer: Self-pay | Admitting: Physician Assistant

## 2020-03-08 DIAGNOSIS — Z1231 Encounter for screening mammogram for malignant neoplasm of breast: Secondary | ICD-10-CM

## 2020-03-24 ENCOUNTER — Ambulatory Visit: Payer: Medicaid Other

## 2020-04-06 ENCOUNTER — Ambulatory Visit: Payer: Medicaid Other

## 2020-05-06 ENCOUNTER — Ambulatory Visit
Admission: RE | Admit: 2020-05-06 | Discharge: 2020-05-06 | Disposition: A | Discharge status: Home / Self Care | Payer: Medicaid Other | Source: Ambulatory Visit | Attending: Physician Assistant | Admitting: Physician Assistant

## 2020-05-06 ENCOUNTER — Other Ambulatory Visit: Payer: Self-pay

## 2020-05-06 DIAGNOSIS — Z1231 Encounter for screening mammogram for malignant neoplasm of breast: Secondary | ICD-10-CM

## 2020-05-12 ENCOUNTER — Telehealth: Payer: Self-pay | Admitting: *Deleted

## 2020-05-12 ENCOUNTER — Telehealth: Payer: Medicaid Other | Admitting: Family

## 2020-05-12 DIAGNOSIS — Z20822 Contact with and (suspected) exposure to covid-19: Secondary | ICD-10-CM | POA: Diagnosis not present

## 2020-05-12 MED ORDER — BENZONATATE 100 MG PO CAPS: 100.0000 mg | ORAL_CAPSULE | Freq: Three times a day (TID) | ORAL | 0 refills | Status: AC | PRN

## 2020-05-12 NOTE — Progress Notes (Signed)
E-Visit for Corona Virus Screening  Your current symptoms could be consistent with the coronavirus.  Many health care providers can now test patients at their office but not all are.  [facility] has multiple testing sites. For information on our COVID testing locations and hours go to [email or web address]  We are enrolling you in our MyChart Home Monitoring for COVID19 . Daily you will receive a questionnaire within the MyChart website. Our COVID 19 response team will be monitoring your responses daily.  Testing Information: The COVID-19 Community Testing sites are testing BY APPOINTMENT ONLY.  You can schedule online at [email or web address]  If you do not have access to a smart phone or computer you may call [phone] for an appointment.   Additional testing sites in the Community:  . For [facility] Testing sites in [place]  https://www.cvs.com/minuteclinic/covid-19-testing  . For Pop-up testing sites in [place]  https://covid19.ncdhhs.gov/about-covid-19/testing/find-my-testing-place/pop-testing-sites  . For [facility] https://[place].gov[place]/human-services/health-department/coronavirus-covid-19-info/covid-19-testing  . For [place] testing in [place] https://www.[place].gov/[place]/human-services/health-department/coronavirus-covid-19-info/covid-19-testing  . For [facility] testing in [place]   https://[facility].care/covidtesting  For  more information about community testing call [phone]   Please quarantine yourself while awaiting your test results. Please stay home for a minimum of 10 days from the first day of illness with improving symptoms and you have had 24 hours of no fever (without the use of Tylenol (Acetaminophen) Motrin (Ibuprofen) or any fever reducing medication).  Also - Do not get tested prior to returning to work because once you have had a positive test the test can stay  positive for more than a month in some cases.   You should wear a mask or cloth face covering over your nose and mouth if you must be around other people or animals, including pets (even at home). Try to stay at least 6 feet away from other people. This will protect the people around you.  Please continue good preventive care measures, including:  frequent hand-washing, avoid touching your face, cover coughs/sneezes, stay out of crowds and keep a 6 foot distance from others.  COVID-19 is a respiratory illness with symptoms that are similar to the flu. Symptoms are typically mild to moderate, but there have been cases of severe illness and death due to the virus.   The following symptoms may appear 2-14 days after exposure: . Fever . Cough . Shortness of breath or difficulty breathing . Chills . Repeated shaking with chills . Muscle pain . Headache . Sore throat . New loss of taste or smell . Fatigue . Congestion or runny nose . Nausea or vomiting . Diarrhea  Go to the nearest hospital ED for assessment if fever/cough/breathlessness are severe or illness seems like a threat to life.  It is vitally important that if you feel that you have an infection such as this virus or any other virus that you stay home and away from places where you may spread it to others.  You should avoid contact with people age 65 and older.   You can use medication such as A prescription cough medication called Tessalon Perles 100 mg. You may take 1-2 capsules every 8 hours as needed for cough.  You may also take acetaminophen (Tylenol) as needed for fever.  Reduce your risk of any infection by using the same precautions used for avoiding the common cold or flu:  . Wash your hands often with soap and warm water for at least 20 seconds.  If soap and water are not   readily available, use an alcohol-based hand sanitizer with at least 60% alcohol.  . If coughing or sneezing, cover your mouth and nose by coughing or  sneezing into the elbow areas of your shirt or coat, into a tissue or into your sleeve (not your hands). . Avoid shaking hands with others and consider head nods or verbal greetings only. . Avoid touching your eyes, nose, or mouth with unwashed hands.  . Avoid close contact with people who are sick. . Avoid places or events with large numbers of people in one location, like concerts or sporting events. . Carefully consider travel plans you have or are making. . If you are planning any travel outside or inside the [place], visit the CDC's Travelers' Health webpage for the latest health notices. . If you have some symptoms but not all symptoms, continue to monitor at home and seek medical attention if your symptoms worsen. . If you are having a medical emergency, call 911.  HOME CARE . Only take medications as instructed by your medical team. . Drink plenty of fluids and get plenty of rest. . A steam or ultrasonic humidifier can help if you have congestion.   GET HELP RIGHT AWAY IF YOU HAVE EMERGENCY WARNING SIGNS** FOR COVID-19. If you or someone is showing any of these signs seek emergency medical care immediately. Call 911 or proceed to your closest emergency facility if: . You develop worsening high fever. . Trouble breathing . Bluish lips or face . Persistent pain or pressure in the chest . New confusion . Inability to wake or stay awake . You cough up blood. . Your symptoms become more severe  **This list is not all possible symptoms. Contact your medical provider for any symptoms that are sever or concerning to you.  MAKE SURE YOU   Understand these instructions.  Will watch your condition.  Will get help right away if you are not doing well or get worse.  Your e-visit answers were reviewed by a board certified advanced clinical practitioner to complete your personal care plan.  Depending on the condition, your plan could have included both over the counter or prescription  medications.  If there is a problem please reply once you have received a response from your provider.  Your safety is important to us.  If you have drug allergies check your prescription carefully.    You can use MyChart to ask questions about today's visit, request a non-urgent call back, or ask for a work or school excuse for 24 hours related to this e-Visit. If it has been greater than 24 hours you will need to follow up with your provider, or enter a new e-Visit to address those concerns. You will get an e-mail in the next two days asking about your experience.  I hope that your e-visit has been valuable and will speed your recovery. Thank you for using e-visits.   Approximately 5 minutes was spent documenting and reviewing patient's chart.   
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
## 2020-05-21 ENCOUNTER — Encounter (INDEPENDENT_AMBULATORY_CARE_PROVIDER_SITE_OTHER): Payer: Self-pay

## 2020-05-24 ENCOUNTER — Encounter (INDEPENDENT_AMBULATORY_CARE_PROVIDER_SITE_OTHER): Payer: Self-pay

## 2020-09-21 ENCOUNTER — Encounter (HOSPITAL_BASED_OUTPATIENT_CLINIC_OR_DEPARTMENT_OTHER): Payer: Self-pay

## 2020-09-21 ENCOUNTER — Emergency Department (HOSPITAL_BASED_OUTPATIENT_CLINIC_OR_DEPARTMENT_OTHER)
Admission: EM | Admit: 2020-09-21 | Discharge: 2020-09-21 | Disposition: A | Discharge status: Home / Self Care | Payer: Medicaid Other | Attending: Emergency Medicine | Admitting: Emergency Medicine

## 2020-09-21 ENCOUNTER — Other Ambulatory Visit: Payer: Self-pay

## 2020-09-21 DIAGNOSIS — R109 Unspecified abdominal pain: Secondary | ICD-10-CM | POA: Insufficient documentation

## 2020-09-21 DIAGNOSIS — Z5321 Procedure and treatment not carried out due to patient leaving prior to being seen by health care provider: Secondary | ICD-10-CM | POA: Insufficient documentation

## 2020-09-21 DIAGNOSIS — R110 Nausea: Secondary | ICD-10-CM | POA: Insufficient documentation

## 2020-09-21 LAB — COMPREHENSIVE METABOLIC PANEL
ALT: 15 U/L (ref 0–44)
AST: 19 U/L (ref 15–41)
Albumin: 3.9 g/dL (ref 3.5–5.0)
Alkaline Phosphatase: 58 U/L (ref 38–126)
Anion gap: 9 (ref 5–15)
BUN: 13 mg/dL (ref 6–20)
CO2: 26 mmol/L (ref 22–32)
Calcium: 9.1 mg/dL (ref 8.9–10.3)
Chloride: 100 mmol/L (ref 98–111)
Creatinine, Ser: 0.82 mg/dL (ref 0.44–1.00)
GFR, Estimated: >60 mL/min (ref >60)
Glucose, Bld: 113 mg/dL — ABNORMAL HIGH (ref 70–99)
Potassium: 3.7 mmol/L (ref 3.5–5.1)
Sodium: 135 mmol/L (ref 135–145)
Total Bilirubin: 0.4 mg/dL (ref 0.3–1.2)
Total Protein: 7.4 g/dL (ref 6.5–8.1)

## 2020-09-21 LAB — CBC
HCT: 39.4 % (ref 36.0–46.0)
Hemoglobin: 13.2 g/dL (ref 12.0–15.0)
MCH: 28.1 pg (ref 26.0–34.0)
MCHC: 33.5 g/dL (ref 30.0–36.0)
MCV: 84 fL (ref 80.0–100.0)
Platelets: 259 10*3/uL (ref 150–400)
RBC: 4.69 MIL/uL (ref 3.87–5.11)
RDW: 12.9 % (ref 11.5–15.5)
WBC: 6.8 10*3/uL (ref 4.0–10.5)
nRBC: 0 % (ref 0.0–0.2)

## 2020-09-21 LAB — URINALYSIS, MICROSCOPIC (REFLEX)

## 2020-09-21 LAB — URINALYSIS, ROUTINE W REFLEX MICROSCOPIC
Bilirubin Urine: NEGATIVE
Glucose, UA: NEGATIVE mg/dL
Hgb urine dipstick: NEGATIVE
Ketones, ur: NEGATIVE mg/dL
Nitrite: NEGATIVE
Protein, ur: NEGATIVE mg/dL
Specific Gravity, Urine: 1.02 (ref 1.005–1.030)
pH: 6 (ref 5.0–8.0)

## 2020-09-21 LAB — LIPASE, BLOOD: Lipase: 28 U/L (ref 11–51)

## 2020-09-21 LAB — PREGNANCY, URINE: Preg Test, Ur: NEGATIVE

## 2020-09-21 NOTE — ED Triage Notes (Signed)
Pt c/o right side abd pain started ~2pm-NAD-steady gait
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
## 2021-01-27 ENCOUNTER — Other Ambulatory Visit (HOSPITAL_BASED_OUTPATIENT_CLINIC_OR_DEPARTMENT_OTHER): Payer: Self-pay

## 2021-01-27 ENCOUNTER — Emergency Department (HOSPITAL_BASED_OUTPATIENT_CLINIC_OR_DEPARTMENT_OTHER): Payer: Medicaid Other

## 2021-01-27 ENCOUNTER — Other Ambulatory Visit: Payer: Self-pay

## 2021-01-27 ENCOUNTER — Encounter (HOSPITAL_BASED_OUTPATIENT_CLINIC_OR_DEPARTMENT_OTHER): Payer: Self-pay | Admitting: *Deleted

## 2021-01-27 ENCOUNTER — Emergency Department (HOSPITAL_BASED_OUTPATIENT_CLINIC_OR_DEPARTMENT_OTHER)
Admission: EM | Admit: 2021-01-27 | Discharge: 2021-01-27 | Disposition: A | Discharge status: Home / Self Care | Payer: Medicaid Other | Attending: Emergency Medicine | Admitting: Emergency Medicine

## 2021-01-27 DIAGNOSIS — W19XXXA Unspecified fall, initial encounter: Secondary | ICD-10-CM

## 2021-01-27 DIAGNOSIS — W228XXA Striking against or struck by other objects, initial encounter: Secondary | ICD-10-CM | POA: Diagnosis not present

## 2021-01-27 DIAGNOSIS — S92352A Displaced fracture of fifth metatarsal bone, left foot, initial encounter for closed fracture: Secondary | ICD-10-CM | POA: Diagnosis not present

## 2021-01-27 DIAGNOSIS — B372 Candidiasis of skin and nail: Secondary | ICD-10-CM

## 2021-01-27 DIAGNOSIS — B379 Candidiasis, unspecified: Secondary | ICD-10-CM | POA: Diagnosis not present

## 2021-01-27 DIAGNOSIS — Z87891 Personal history of nicotine dependence: Secondary | ICD-10-CM | POA: Insufficient documentation

## 2021-01-27 DIAGNOSIS — S99922A Unspecified injury of left foot, initial encounter: Secondary | ICD-10-CM | POA: Diagnosis present

## 2021-01-27 DIAGNOSIS — S92342A Displaced fracture of fourth metatarsal bone, left foot, initial encounter for closed fracture: Secondary | ICD-10-CM | POA: Diagnosis not present

## 2021-01-27 DIAGNOSIS — S92302A Fracture of unspecified metatarsal bone(s), left foot, initial encounter for closed fracture: Secondary | ICD-10-CM

## 2021-01-27 MED ORDER — NYSTATIN 100000 UNIT/GM EX CREA
TOPICAL_CREAM | CUTANEOUS | 0 refills | Status: AC
  Filled 2021-01-27: qty 30, 10d supply, fill #0

## 2021-01-27 MED ORDER — HYDROCODONE-ACETAMINOPHEN 5-325 MG PO TABS
1.0000 | ORAL_TABLET | Freq: Four times a day (QID) | ORAL | 0 refills | Status: AC | PRN
  Filled 2021-01-27: qty 6, 2d supply, fill #0

## 2021-01-27 NOTE — ED Triage Notes (Signed)
Left great toe injury 2 days ago. She fell with injury to her foot after the toe injury.  She feels she has a yeast infection under her abdomen.
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
## 2021-01-27 NOTE — Discharge Instructions (Addendum)
As we discussed, today your x-ray showed no broken bones of the fourth and fifth both bones of your foot.  Wear the cam walker boot for support and stabilization.  You will need to follow-up with orthopedics.  You can take Tylenol or Ibuprofen as directed for pain. You can alternate Tylenol and Ibuprofen every 4 hours. If you take Tylenol at 1pm, then you can take Ibuprofen at 5pm. Then you can take Tylenol again at 9pm.   Take pain medications as directed for break through pain. Do not drive or operate machinery while taking this medication.   Use nystatin powder for rash.  Return to emergency department for worsening pain, numbness/weakness or any other worsening concerning symptoms.

## 2021-02-04 ENCOUNTER — Ambulatory Visit (HOSPITAL_COMMUNITY)
Admission: EM | Admit: 2021-02-04 | Discharge: 2021-02-04 | Disposition: A | Discharge status: Home / Self Care | Payer: Medicaid Other | Attending: Psychiatry | Admitting: Psychiatry

## 2021-02-04 ENCOUNTER — Other Ambulatory Visit: Payer: Self-pay

## 2021-02-04 DIAGNOSIS — Z733 Stress, not elsewhere classified: Secondary | ICD-10-CM | POA: Diagnosis not present

## 2021-02-04 DIAGNOSIS — F4323 Adjustment disorder with mixed anxiety and depressed mood: Secondary | ICD-10-CM | POA: Diagnosis not present

## 2021-02-04 DIAGNOSIS — Z79899 Other long term (current) drug therapy: Secondary | ICD-10-CM | POA: Insufficient documentation

## 2021-02-04 DIAGNOSIS — Z5989 Other problems related to housing and economic circumstances: Secondary | ICD-10-CM | POA: Diagnosis not present

## 2021-02-04 DIAGNOSIS — F1111 Opioid abuse, in remission: Secondary | ICD-10-CM | POA: Insufficient documentation

## 2021-02-04 DIAGNOSIS — Z5901 Sheltered homelessness: Secondary | ICD-10-CM | POA: Insufficient documentation

## 2021-02-04 DIAGNOSIS — Z79891 Long term (current) use of opiate analgesic: Secondary | ICD-10-CM | POA: Insufficient documentation

## 2021-02-04 NOTE — ED Notes (Signed)
Called pt's mother for transport to home care. Mother stated she would be here shortly.
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
## 2021-02-04 NOTE — ED Notes (Signed)
Belongings in locker 26 

## 2021-02-04 NOTE — Discharge Summary (Signed)
Shelly Cummings to be D/C'd Home per NP order. Discussed with the patient and all questions fully answered. An After Visit Summary was printed and given to the patient. Patient escorted out and D/C home via private auto.  Dickie La  02/04/2021 2:09 PM
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
## 2021-02-04 NOTE — Discharge Instructions (Addendum)

## 2021-04-07 ENCOUNTER — Other Ambulatory Visit: Payer: Self-pay | Admitting: Physician Assistant

## 2021-04-07 DIAGNOSIS — Z1231 Encounter for screening mammogram for malignant neoplasm of breast: Secondary | ICD-10-CM

## 2021-05-22 ENCOUNTER — Emergency Department (HOSPITAL_COMMUNITY): Payer: Medicaid Other

## 2021-05-22 ENCOUNTER — Emergency Department (HOSPITAL_COMMUNITY)
Admission: EM | Admit: 2021-05-22 | Discharge: 2021-05-22 | Disposition: A | Discharge status: Home / Self Care | Payer: Medicaid Other | Attending: Emergency Medicine | Admitting: Emergency Medicine

## 2021-05-22 ENCOUNTER — Other Ambulatory Visit: Payer: Self-pay

## 2021-05-22 ENCOUNTER — Encounter (HOSPITAL_COMMUNITY): Payer: Self-pay

## 2021-05-22 DIAGNOSIS — R0789 Other chest pain: Secondary | ICD-10-CM | POA: Diagnosis not present

## 2021-05-22 DIAGNOSIS — R42 Dizziness and giddiness: Secondary | ICD-10-CM | POA: Insufficient documentation

## 2021-05-22 DIAGNOSIS — E669 Obesity, unspecified: Secondary | ICD-10-CM | POA: Diagnosis not present

## 2021-05-22 DIAGNOSIS — N9489 Other specified conditions associated with female genital organs and menstrual cycle: Secondary | ICD-10-CM | POA: Insufficient documentation

## 2021-05-22 DIAGNOSIS — F419 Anxiety disorder, unspecified: Secondary | ICD-10-CM | POA: Insufficient documentation

## 2021-05-22 DIAGNOSIS — R0600 Dyspnea, unspecified: Secondary | ICD-10-CM

## 2021-05-22 DIAGNOSIS — R110 Nausea: Secondary | ICD-10-CM | POA: Diagnosis not present

## 2021-05-22 DIAGNOSIS — R0602 Shortness of breath: Secondary | ICD-10-CM | POA: Insufficient documentation

## 2021-05-22 DIAGNOSIS — Z87891 Personal history of nicotine dependence: Secondary | ICD-10-CM | POA: Diagnosis not present

## 2021-05-22 LAB — D-DIMER, QUANTITATIVE: D-Dimer, Quant: 0.86 ug/mL-FEU — ABNORMAL HIGH (ref 0.00–0.50)

## 2021-05-22 LAB — CBC
HCT: 40.3 % (ref 36.0–46.0)
Hemoglobin: 13.1 g/dL (ref 12.0–15.0)
MCH: 27.5 pg (ref 26.0–34.0)
MCHC: 32.5 g/dL (ref 30.0–36.0)
MCV: 84.5 fL (ref 80.0–100.0)
Platelets: 283 10*3/uL (ref 150–400)
RBC: 4.77 MIL/uL (ref 3.87–5.11)
RDW: 13.1 % (ref 11.5–15.5)
WBC: 10.1 10*3/uL (ref 4.0–10.5)
nRBC: 0 % (ref 0.0–0.2)

## 2021-05-22 LAB — BASIC METABOLIC PANEL
Anion gap: 11 (ref 5–15)
BUN: 6 mg/dL (ref 6–20)
CO2: 24 mmol/L (ref 22–32)
Calcium: 9.5 mg/dL (ref 8.9–10.3)
Chloride: 100 mmol/L (ref 98–111)
Creatinine, Ser: 0.84 mg/dL (ref 0.44–1.00)
GFR, Estimated: >60 mL/min (ref >60)
Glucose, Bld: 145 mg/dL — ABNORMAL HIGH (ref 70–99)
Potassium: 4.1 mmol/L (ref 3.5–5.1)
Sodium: 135 mmol/L (ref 135–145)

## 2021-05-22 LAB — I-STAT BETA HCG BLOOD, ED (MC, WL, AP ONLY): I-stat hCG, quantitative: <5 m[IU]/mL (ref <5)

## 2021-05-22 LAB — CBG MONITORING, ED: Glucose-Capillary: 129 mg/dL — ABNORMAL HIGH (ref 70–99)

## 2021-05-22 LAB — TROPONIN I (HIGH SENSITIVITY)
Troponin I (High Sensitivity): 3 ng/L (ref <18)
Troponin I (High Sensitivity): 4 ng/L (ref <18)

## 2021-05-22 MED ORDER — IOHEXOL 350 MG/ML SOLN
75.0000 mL | Freq: Once | INTRAVENOUS | Status: AC | PRN
  Administered 2021-05-22: 75 mL via INTRAVENOUS

## 2021-05-22 MED ORDER — CLONAZEPAM 0.5 MG PO TABS
0.5000 mg | ORAL_TABLET | Freq: Once | ORAL | Status: AC
  Administered 2021-05-22: 0.5 mg via ORAL
  Filled 2021-05-22: qty 1
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
## 2021-05-22 NOTE — ED Triage Notes (Signed)
Pt comes via [facility] for CP and SOB that woke her up around 330, PTA received 324 ASA and 3 nitro with some relief
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
## 2021-05-22 NOTE — ED Notes (Signed)
Patient transported to CT 

## 2021-05-22 NOTE — ED Notes (Signed)
Pt asked to speak with triage RN. Triage RN notified.

## 2021-05-22 NOTE — Discharge Instructions (Addendum)
You been seen in the emergency department for shortness of breath and chest tightness.  Your symptoms are likely due to your lungs being aggravated by the air in the hotel you are staying at.  Please treat your symptoms with your inhaler that you have at home for asthma.  Follow-up with general surgery regarding your gallbladder complaints. Please follow-up with your PCP regarding your recent emergency department visit. Return to the emergency department if you develop worsening symptoms.

## 2021-05-23 ENCOUNTER — Ambulatory Visit: Payer: Medicaid Other

## 2021-06-13 ENCOUNTER — Ambulatory Visit: Payer: Self-pay | Admitting: Surgery

## 2021-06-13 ENCOUNTER — Encounter: Payer: Self-pay | Admitting: Surgery

## 2021-06-20 ENCOUNTER — Ambulatory Visit: Payer: Medicaid Other

## 2021-07-02 ENCOUNTER — Ambulatory Visit: Payer: Medicaid Other

## 2021-08-01 ENCOUNTER — Ambulatory Visit: Payer: Medicaid Other

## 2021-08-18 ENCOUNTER — Other Ambulatory Visit: Payer: Self-pay

## 2021-08-18 ENCOUNTER — Ambulatory Visit
Admission: RE | Admit: 2021-08-18 | Discharge: 2021-08-18 | Disposition: A | Discharge status: Home / Self Care | Payer: Medicaid Other | Source: Ambulatory Visit | Attending: Physician Assistant | Admitting: Physician Assistant

## 2021-08-18 DIAGNOSIS — Z1231 Encounter for screening mammogram for malignant neoplasm of breast: Secondary | ICD-10-CM

## 2021-11-28 ENCOUNTER — Other Ambulatory Visit (HOSPITAL_COMMUNITY): Payer: Self-pay

## 2022-02-18 DEATH — deceased

## 2022-06-12 ENCOUNTER — Other Ambulatory Visit: Payer: Self-pay

## 2024-03-20 ENCOUNTER — Other Ambulatory Visit: Payer: Self-pay
